# Patient Record
Sex: Female | Born: 1980 | Race: Black or African American | Hispanic: No | Marital: Single | Smoking: Former smoker
Health system: Southern US, Community
[De-identification: ages and names within clinical notes are randomized; demographics above are authoritative.]

## PROBLEM LIST (undated history)

## (undated) DIAGNOSIS — T783XXA Angioneurotic edema, initial encounter: Secondary | ICD-10-CM

## (undated) DIAGNOSIS — R45851 Suicidal ideations: Secondary | ICD-10-CM

## (undated) DIAGNOSIS — F32A Depression, unspecified: Secondary | ICD-10-CM

## (undated) DIAGNOSIS — A749 Chlamydial infection, unspecified: Secondary | ICD-10-CM

## (undated) DIAGNOSIS — G4730 Sleep apnea, unspecified: Secondary | ICD-10-CM

## (undated) DIAGNOSIS — A599 Trichomoniasis, unspecified: Secondary | ICD-10-CM

## (undated) DIAGNOSIS — J069 Acute upper respiratory infection, unspecified: Secondary | ICD-10-CM

## (undated) DIAGNOSIS — F419 Anxiety disorder, unspecified: Secondary | ICD-10-CM

## (undated) DIAGNOSIS — J45909 Unspecified asthma, uncomplicated: Secondary | ICD-10-CM

## (undated) DIAGNOSIS — I10 Essential (primary) hypertension: Secondary | ICD-10-CM

## (undated) DIAGNOSIS — F329 Major depressive disorder, single episode, unspecified: Secondary | ICD-10-CM

## (undated) DIAGNOSIS — L309 Dermatitis, unspecified: Secondary | ICD-10-CM

## (undated) HISTORY — DX: Dermatitis, unspecified: L30.9

## (undated) HISTORY — DX: Acute upper respiratory infection, unspecified: J06.9

## (undated) HISTORY — DX: Angioneurotic edema, initial encounter: T78.3XXA

## (undated) HISTORY — PX: OTHER SURGICAL HISTORY: SHX169

---

## 2011-12-18 ENCOUNTER — Emergency Department (HOSPITAL_COMMUNITY)
Admission: EM | Admit: 2011-12-18 | Discharge: 2011-12-18 | Disposition: A | Discharge status: Home / Self Care | Payer: Self-pay | Attending: Emergency Medicine | Admitting: Emergency Medicine

## 2011-12-18 ENCOUNTER — Encounter (HOSPITAL_COMMUNITY): Payer: Self-pay | Admitting: Family Medicine

## 2011-12-18 DIAGNOSIS — R109 Unspecified abdominal pain: Secondary | ICD-10-CM | POA: Insufficient documentation

## 2011-12-18 DIAGNOSIS — R079 Chest pain, unspecified: Secondary | ICD-10-CM | POA: Insufficient documentation

## 2011-12-18 DIAGNOSIS — R110 Nausea: Secondary | ICD-10-CM | POA: Insufficient documentation

## 2011-12-18 DIAGNOSIS — N898 Other specified noninflammatory disorders of vagina: Secondary | ICD-10-CM | POA: Insufficient documentation

## 2011-12-18 DIAGNOSIS — Z79899 Other long term (current) drug therapy: Secondary | ICD-10-CM | POA: Insufficient documentation

## 2011-12-18 DIAGNOSIS — F17200 Nicotine dependence, unspecified, uncomplicated: Secondary | ICD-10-CM | POA: Insufficient documentation

## 2011-12-18 DIAGNOSIS — A599 Trichomoniasis, unspecified: Secondary | ICD-10-CM | POA: Insufficient documentation

## 2011-12-18 LAB — CBC WITH DIFFERENTIAL/PLATELET
Basophils Relative: 0 % (ref 0–1)
Eosinophils Absolute: 0.1 10*3/uL (ref 0.0–0.7)
HCT: 33.9 % — ABNORMAL LOW (ref 36.0–46.0)
Lymphs Abs: 1.9 10*3/uL (ref 0.7–4.0)
MCV: 89 fL (ref 78.0–100.0)
Monocytes Relative: 9 % (ref 3–12)
RDW: 13 % (ref 11.5–15.5)
WBC: 12.6 10*3/uL — ABNORMAL HIGH (ref 4.0–10.5)

## 2011-12-18 LAB — WET PREP, GENITAL: Yeast Wet Prep HPF POC: NONE SEEN

## 2011-12-18 LAB — COMPREHENSIVE METABOLIC PANEL
ALT: 28 U/L (ref 0–35)
AST: 19 U/L (ref 0–37)
Albumin: 3.6 g/dL (ref 3.5–5.2)
Alkaline Phosphatase: 75 U/L (ref 39–117)
BUN: 8 mg/dL (ref 6–23)
Chloride: 101 mEq/L (ref 96–112)
Potassium: 3.3 mEq/L — ABNORMAL LOW (ref 3.5–5.1)
Sodium: 138 mEq/L (ref 135–145)
Total Bilirubin: 0.4 mg/dL (ref 0.3–1.2)
Total Protein: 7.6 g/dL (ref 6.0–8.3)

## 2011-12-18 LAB — URINALYSIS, ROUTINE W REFLEX MICROSCOPIC
Glucose, UA: NEGATIVE mg/dL
Ketones, ur: 40 mg/dL — AB
Leukocytes, UA: NEGATIVE
Protein, ur: 30 mg/dL — AB
pH: 6.5 (ref 5.0–8.0)

## 2011-12-18 LAB — URINE MICROSCOPIC-ADD ON

## 2011-12-18 MED ORDER — CEFTRIAXONE SODIUM 250 MG IJ SOLR
250.0000 mg | Freq: Once | INTRAMUSCULAR | Status: AC
  Administered 2011-12-18: 250 mg via INTRAMUSCULAR
  Filled 2011-12-18: qty 250

## 2011-12-18 MED ORDER — AZITHROMYCIN 1 G PO PACK
1.0000 g | PACK | Freq: Once | ORAL | Status: AC
  Administered 2011-12-18: 1 g via ORAL
  Filled 2011-12-18: qty 1

## 2011-12-18 MED ORDER — OXYCODONE-ACETAMINOPHEN 5-325 MG PO TABS
2.0000 | ORAL_TABLET | Freq: Once | ORAL | Status: AC
  Administered 2011-12-18: 2 via ORAL
  Filled 2011-12-18: qty 2

## 2011-12-18 MED ORDER — OXYCODONE-ACETAMINOPHEN 5-325 MG PO TABS: 1.0000 | ORAL_TABLET | Freq: Four times a day (QID) | ORAL | Status: AC | PRN

## 2011-12-18 MED ORDER — METRONIDAZOLE 500 MG PO TABS: 500.0000 mg | ORAL_TABLET | Freq: Two times a day (BID) | ORAL | Status: AC

## 2011-12-18 NOTE — ED Notes (Signed)
Called patient for vitals. No answer.

## 2011-12-18 NOTE — ED Notes (Signed)
Per pt sts abdominal pain, nausea and chest pain that started this am about 6. sts the abdominal pain is generalized. Denies vomiting or diarrhea. Denies urinary symptoms. sts last bm was yesterday and normal. Spotting from period right now.

## 2011-12-18 NOTE — ED Provider Notes (Signed)
History     CSN: [id number]  Arrival date & time 12/18/11  1516   First MD Initiated Contact with Patient 12/18/11 2013      Chief Complaint  Patient presents with  . Abdominal Pain  . Chest Pain  . Nausea    (Consider location/radiation/quality/duration/timing/severity/associated sxs/prior treatment) HPI Comments: Rose Gutierrez 31 y.o. female   The chief complaint is: Patient presents with:   Abdominal Pain   Chest Pain   Nausea   The patient has medical history significant for:   History reviewed. No pertinent past medical history.  Patient presents with abdominal pain and nausea that began this morning at 6am. Patient states that the abdominal pain is diffuse and about a 7/10. Associated symptoms are chest pain but patient states that she currently is suffering from bronchitis. Denies fever or chills. Denies vomiting or diarrhea but reports constipation. LMP was 8/27 and she is currently spotting. Patient states that last time she felt this way she was pregnant. Patietn is sexually active with one partner and does not use barrier. Denies vaginal discharge, dysuria, urgency, but reports frequency.      Patient is a 31 y.o. female presenting with chest pain. The history is provided by the patient.  Chest Pain Primary symptoms include cough, abdominal pain and nausea. Pertinent negatives for primary symptoms include no fever and no vomiting.     History reviewed. No pertinent past medical history.  History reviewed. No pertinent past surgical history.  History reviewed. No pertinent family history.  History  Substance Use Topics  . Smoking status: Current Everyday Smoker  . Smokeless tobacco: Not on file  . Alcohol Use: No    OB History    Grav Para Term Preterm Abortions TAB SAB Ect Mult Living                  Review of Systems  Constitutional: Negative for fever and chills.  Respiratory: Positive for cough.   Cardiovascular: Positive for chest  pain.  Gastrointestinal: Positive for nausea, abdominal pain and constipation. Negative for vomiting and diarrhea.  Genitourinary: Positive for frequency and vaginal bleeding. Negative for dysuria, urgency and vaginal discharge.  All other systems reviewed and are negative.    Allergies  Review of patient's allergies indicates no known allergies.  Home Medications   Current Outpatient Rx  Name Route Sig Dispense Refill  . ALBUTEROL SULFATE HFA 108 (90 BASE) MCG/ACT IN AERS Inhalation Inhale 2 puffs into the lungs every 6 (six) hours as needed. For stuffiness or shortness of breath    . BROMPHENIRAMINE-PSEUDOEPH 1-15 MG/5ML PO ELIX Oral Take 15 mLs by mouth every 4 (four) hours as needed. For sinus pressure      BP 153/103  Pulse 68  Temp 100.4 F (38 C) (Oral)  Resp 18  SpO2 97%  LMP 12/17/2011  Physical Exam  Nursing note and vitals reviewed. Constitutional: She appears well-developed and well-nourished.  HENT:  Head: Normocephalic and atraumatic.  Mouth/Throat: Oropharynx is clear and moist.  Eyes: Conjunctivae are normal.  Neck: Normal range of motion. Neck supple.  Cardiovascular: Normal rate, regular rhythm and normal heart sounds.   Pulmonary/Chest: Effort normal and breath sounds normal.  Abdominal: Soft. Bowel sounds are normal. She exhibits no distension and no mass. There is tenderness. There is no rebound and no guarding.       Patient tender suprapubic and RUQ.  Neurological: She is alert.  Skin: Skin is warm and dry.    ED  Course  Procedures (including critical care time)  Labs Reviewed  CBC WITH DIFFERENTIAL - Abnormal; Notable for the following:    WBC 12.6 (*)     RBC 3.81 (*)     Hemoglobin 11.0 (*)     HCT 33.9 (*)     Neutro Abs 9.5 (*)     Monocytes Absolute 1.1 (*)     All other components within normal limits  COMPREHENSIVE METABOLIC PANEL - Abnormal; Notable for the following:    Potassium 3.3 (*)     All other components within normal  limits  URINALYSIS, ROUTINE W REFLEX MICROSCOPIC - Abnormal; Notable for the following:    Color, Urine AMBER (*)  BIOCHEMICALS MAY BE AFFECTED BY COLOR   APPearance CLOUDY (*)     Hgb urine dipstick LARGE (*)     Bilirubin Urine SMALL (*)     Ketones, ur 40 (*)     Protein, ur 30 (*)     Urobilinogen, UA 2.0 (*)     All other components within normal limits  URINE MICROSCOPIC-ADD ON - Abnormal; Notable for the following:    Squamous Epithelial / LPF FEW (*)     Bacteria, UA FEW (*)     All other components within normal limits  PREGNANCY, URINE  WET PREP, GENITAL  GC/CHLAMYDIA PROBE AMP, GENITAL   Results for orders placed during the hospital encounter of 12/18/11  CBC WITH DIFFERENTIAL      Component Value Range   WBC 12.6 (*) 4.0 - 10.5 K/uL   RBC 3.81 (*) 3.87 - 5.11 MIL/uL   Hemoglobin 11.0 (*) 12.0 - 15.0 g/dL   HCT 16.1 (*) 09.6 - 04.5 %   MCV 89.0  78.0 - 100.0 fL   MCH 28.9  26.0 - 34.0 pg   MCHC 32.4  30.0 - 36.0 g/dL   RDW 40.9  81.1 - 91.4 %   Platelets 345  150 - 400 K/uL   Neutrophils Relative 76  43 - 77 %   Neutro Abs 9.5 (*) 1.7 - 7.7 K/uL   Lymphocytes Relative 15  12 - 46 %   Lymphs Abs 1.9  0.7 - 4.0 K/uL   Monocytes Relative 9  3 - 12 %   Monocytes Absolute 1.1 (*) 0.1 - 1.0 K/uL   Eosinophils Relative 1  0 - 5 %   Eosinophils Absolute 0.1  0.0 - 0.7 K/uL   Basophils Relative 0  0 - 1 %   Basophils Absolute 0.0  0.0 - 0.1 K/uL  COMPREHENSIVE METABOLIC PANEL      Component Value Range   Sodium 138  135 - 145 mEq/L   Potassium 3.3 (*) 3.5 - 5.1 mEq/L   Chloride 101  96 - 112 mEq/L   CO2 28  19 - 32 mEq/L   Glucose, Bld 93  70 - 99 mg/dL   BUN 8  6 - 23 mg/dL   Creatinine, Ser 7.82  0.50 - 1.10 mg/dL   Calcium 9.3  8.4 - 95.6 mg/dL   Total Protein 7.6  6.0 - 8.3 g/dL   Albumin 3.6  3.5 - 5.2 g/dL   AST 19  0 - 37 U/L   ALT 28  0 - 35 U/L   Alkaline Phosphatase 75  39 - 117 U/L   Total Bilirubin 0.4  0.3 - 1.2 mg/dL   GFR calc non Af Amer >90   >90 mL/min   GFR calc Af Amer >90  >90  mL/min  URINALYSIS, ROUTINE W REFLEX MICROSCOPIC      Component Value Range   Color, Urine AMBER (*) YELLOW   APPearance CLOUDY (*) CLEAR   Specific Gravity, Urine 1.027  1.005 - 1.030   pH 6.5  5.0 - 8.0   Glucose, UA NEGATIVE  NEGATIVE mg/dL   Hgb urine dipstick LARGE (*) NEGATIVE   Bilirubin Urine SMALL (*) NEGATIVE   Ketones, ur 40 (*) NEGATIVE mg/dL   Protein, ur 30 (*) NEGATIVE mg/dL   Urobilinogen, UA 2.0 (*) 0.0 - 1.0 mg/dL   Nitrite NEGATIVE  NEGATIVE   Leukocytes, UA NEGATIVE  NEGATIVE  PREGNANCY, URINE      Component Value Range   Preg Test, Ur NEGATIVE  NEGATIVE  URINE MICROSCOPIC-ADD ON      Component Value Range   Squamous Epithelial / LPF FEW (*) RARE   WBC, UA 0-2  <3 WBC/hpf   RBC / HPF 21-50  <3 RBC/hpf   Bacteria, UA FEW (*) RARE   Urine-Other MUCOUS PRESENT     Results for orders placed during the hospital encounter of 12/18/11  CBC WITH DIFFERENTIAL      Component Value Range   WBC 12.6 (*) 4.0 - 10.5 K/uL   RBC 3.81 (*) 3.87 - 5.11 MIL/uL   Hemoglobin 11.0 (*) 12.0 - 15.0 g/dL   HCT 56.2 (*) 13.0 - 86.5 %   MCV 89.0  78.0 - 100.0 fL   MCH 28.9  26.0 - 34.0 pg   MCHC 32.4  30.0 - 36.0 g/dL   RDW 78.4  69.6 - 29.5 %   Platelets 345  150 - 400 K/uL   Neutrophils Relative 76  43 - 77 %   Neutro Abs 9.5 (*) 1.7 - 7.7 K/uL   Lymphocytes Relative 15  12 - 46 %   Lymphs Abs 1.9  0.7 - 4.0 K/uL   Monocytes Relative 9  3 - 12 %   Monocytes Absolute 1.1 (*) 0.1 - 1.0 K/uL   Eosinophils Relative 1  0 - 5 %   Eosinophils Absolute 0.1  0.0 - 0.7 K/uL   Basophils Relative 0  0 - 1 %   Basophils Absolute 0.0  0.0 - 0.1 K/uL  COMPREHENSIVE METABOLIC PANEL      Component Value Range   Sodium 138  135 - 145 mEq/L   Potassium 3.3 (*) 3.5 - 5.1 mEq/L   Chloride 101  96 - 112 mEq/L   CO2 28  19 - 32 mEq/L   Glucose, Bld 93  70 - 99 mg/dL   BUN 8  6 - 23 mg/dL   Creatinine, Ser 2.84  0.50 - 1.10 mg/dL   Calcium 9.3  8.4 -  13.2 mg/dL   Total Protein 7.6  6.0 - 8.3 g/dL   Albumin 3.6  3.5 - 5.2 g/dL   AST 19  0 - 37 U/L   ALT 28  0 - 35 U/L   Alkaline Phosphatase 75  39 - 117 U/L   Total Bilirubin 0.4  0.3 - 1.2 mg/dL   GFR calc non Af Amer >90  >90 mL/min   GFR calc Af Amer >90  >90 mL/min  URINALYSIS, ROUTINE W REFLEX MICROSCOPIC      Component Value Range   Color, Urine AMBER (*) YELLOW   APPearance CLOUDY (*) CLEAR   Specific Gravity, Urine 1.027  1.005 - 1.030   pH 6.5  5.0 - 8.0   Glucose, UA NEGATIVE  NEGATIVE mg/dL   Hgb urine dipstick LARGE (*) NEGATIVE   Bilirubin Urine SMALL (*) NEGATIVE   Ketones, ur 40 (*) NEGATIVE mg/dL   Protein, ur 30 (*) NEGATIVE mg/dL   Urobilinogen, UA 2.0 (*) 0.0 - 1.0 mg/dL   Nitrite NEGATIVE  NEGATIVE   Leukocytes, UA NEGATIVE  NEGATIVE  PREGNANCY, URINE      Component Value Range   Preg Test, Ur NEGATIVE  NEGATIVE  URINE MICROSCOPIC-ADD ON      Component Value Range   Squamous Epithelial / LPF FEW (*) RARE   WBC, UA 0-2  <3 WBC/hpf   RBC / HPF 21-50  <3 RBC/hpf   Bacteria, UA FEW (*) RARE   Urine-Other MUCOUS PRESENT    WET PREP, GENITAL      Component Value Range   Yeast Wet Prep HPF POC NONE SEEN  NONE SEEN   Trich, Wet Prep MODERATE (*) NONE SEEN   Clue Cells Wet Prep HPF POC FEW (*) NONE SEEN   WBC, Wet Prep HPF POC MODERATE (*) NONE SEEN     Date: 12/18/2011  Rate: 69  Rhythm: normal sinus rhythm  QRS Axis: normal  Intervals: normal  ST/T Wave abnormalities: normal  Conduction Disutrbances:none  Narrative Interpretation: No stemi  Old EKG Reviewed: none available   No results found.   1. Trichimoniasis   2. Abdominal pain       MDM  Patient presented with abdominal pain and nausea that began this morning. Patient given pain medication in ED EKG: unremarkable CBC: mild leukocytosis CMP: hypokalemia UA: remarkable for blood as patient is on her period and trichomonas. Pelvic exam: no CMT or adnexal tenderness. Wet prep: confirmatory  for Trich. High white count suspicious for other STD's. Patient treated empirically for gc/chlamydia and discharged on Flagyl. No red flags for PID. Return precautions given verbally and in discharge instructions.      Pixie Casino, PA-C 12/18/11 2244

## 2011-12-18 NOTE — ED Notes (Signed)
Pt in c/o sinus congestion x3-4 days, then developed chest and back pain, then developed generalized abd pain, denies n/v/d, states she is unsure if she is pregnant and that last period was irregular, pt noted spotty vaginal bleeding when abd pain started.

## 2011-12-19 LAB — GC/CHLAMYDIA PROBE AMP, GENITAL
Chlamydia, DNA Probe: NEGATIVE
GC Probe Amp, Genital: NEGATIVE

## 2011-12-19 NOTE — ED Provider Notes (Signed)
Medical screening examination/treatment/procedure(s) were performed by non-physician practitioner and as supervising physician I was immediately available for consultation/collaboration.   Eisha Chatterjee, MD 12/19/11 2213 

## 2012-06-16 ENCOUNTER — Encounter (HOSPITAL_COMMUNITY): Payer: Self-pay | Admitting: *Deleted

## 2012-06-16 ENCOUNTER — Emergency Department (HOSPITAL_COMMUNITY): Payer: Self-pay

## 2012-06-16 ENCOUNTER — Emergency Department (HOSPITAL_COMMUNITY)
Admission: EM | Admit: 2012-06-16 | Discharge: 2012-06-17 | Disposition: A | Discharge status: Home / Self Care | Payer: Self-pay | Attending: Emergency Medicine | Admitting: Emergency Medicine

## 2012-06-16 DIAGNOSIS — G479 Sleep disorder, unspecified: Secondary | ICD-10-CM | POA: Insufficient documentation

## 2012-06-16 DIAGNOSIS — R51 Headache: Secondary | ICD-10-CM | POA: Insufficient documentation

## 2012-06-16 DIAGNOSIS — N390 Urinary tract infection, site not specified: Secondary | ICD-10-CM

## 2012-06-16 DIAGNOSIS — I10 Essential (primary) hypertension: Secondary | ICD-10-CM | POA: Insufficient documentation

## 2012-06-16 DIAGNOSIS — F419 Anxiety disorder, unspecified: Secondary | ICD-10-CM

## 2012-06-16 DIAGNOSIS — F411 Generalized anxiety disorder: Secondary | ICD-10-CM | POA: Insufficient documentation

## 2012-06-16 DIAGNOSIS — R0989 Other specified symptoms and signs involving the circulatory and respiratory systems: Secondary | ICD-10-CM | POA: Insufficient documentation

## 2012-06-16 DIAGNOSIS — Z79899 Other long term (current) drug therapy: Secondary | ICD-10-CM | POA: Insufficient documentation

## 2012-06-16 DIAGNOSIS — R0609 Other forms of dyspnea: Secondary | ICD-10-CM | POA: Insufficient documentation

## 2012-06-16 DIAGNOSIS — F17200 Nicotine dependence, unspecified, uncomplicated: Secondary | ICD-10-CM | POA: Insufficient documentation

## 2012-06-16 DIAGNOSIS — J45909 Unspecified asthma, uncomplicated: Secondary | ICD-10-CM | POA: Insufficient documentation

## 2012-06-16 HISTORY — DX: Unspecified asthma, uncomplicated: J45.909

## 2012-06-16 HISTORY — DX: Essential (primary) hypertension: I10

## 2012-06-16 LAB — COMPREHENSIVE METABOLIC PANEL
Alkaline Phosphatase: 57 U/L (ref 39–117)
BUN: 9 mg/dL (ref 6–23)
Chloride: 102 mEq/L (ref 96–112)
Creatinine, Ser: 0.64 mg/dL (ref 0.50–1.10)
GFR calc Af Amer: >90 mL/min (ref >90)
Glucose, Bld: 93 mg/dL (ref 70–99)
Potassium: 4.2 mEq/L (ref 3.5–5.1)
Total Bilirubin: 0.2 mg/dL — ABNORMAL LOW (ref 0.3–1.2)
Total Protein: 7.5 g/dL (ref 6.0–8.3)

## 2012-06-16 LAB — CBC WITH DIFFERENTIAL/PLATELET
Eosinophils Absolute: 0.1 10*3/uL (ref 0.0–0.7)
HCT: 34.8 % — ABNORMAL LOW (ref 36.0–46.0)
Hemoglobin: 11.4 g/dL — ABNORMAL LOW (ref 12.0–15.0)
Lymphs Abs: 3.1 10*3/uL (ref 0.7–4.0)
MCH: 28.9 pg (ref 26.0–34.0)
Monocytes Absolute: 0.9 10*3/uL (ref 0.1–1.0)
Monocytes Relative: 10 % (ref 3–12)
Neutrophils Relative %: 53 % (ref 43–77)
RBC: 3.94 MIL/uL (ref 3.87–5.11)

## 2012-06-16 LAB — URINALYSIS, ROUTINE W REFLEX MICROSCOPIC
Ketones, ur: 15 mg/dL — AB
Nitrite: POSITIVE — AB
Protein, ur: >300 mg/dL — AB

## 2012-06-16 MED ORDER — LORAZEPAM 1 MG PO TABS: 1.0000 mg | ORAL_TABLET | Freq: Three times a day (TID) | ORAL | Status: DC | PRN

## 2012-06-16 MED ORDER — NITROFURANTOIN MONOHYD MACRO 100 MG PO CAPS: 100.0000 mg | ORAL_CAPSULE | Freq: Two times a day (BID) | ORAL | Status: DC

## 2012-06-16 MED ORDER — LORAZEPAM 2 MG/ML IJ SOLN
1.0000 mg | Freq: Once | INTRAMUSCULAR | Status: AC
  Administered 2012-06-16: 1 mg via INTRAVENOUS
  Filled 2012-06-16: qty 1

## 2012-06-16 MED ORDER — SODIUM CHLORIDE 0.9 % IV BOLUS (SEPSIS)
1000.0000 mL | Freq: Once | INTRAVENOUS | Status: AC
  Administered 2012-06-16: 1000 mL via INTRAVENOUS

## 2012-06-16 NOTE — ED Notes (Signed)
Patient reports having a bad headache which started approximately 1330.  Pt states she did not take anything for her headache.  Reports feeling shaky also.  Patient reports other people have said it sounds like she has anxiety, but she had not seen a psychiatrist or been diagnosed with anxiety.  Patient reports she had some tightness in her chest earlier but not pain and is not having any chest pain.   Denies nausea, vomiting.  Patient does have history of sleep apnea but has not been wearing c-pap at night.

## 2012-06-16 NOTE — ED Notes (Signed)
Per [facility], under a lot of stress, increased anxiety, co-workers at work said she passed out-went straight down from standing position-did not hit head, no LOC noncompliant with PCP

## 2012-06-16 NOTE — ED Provider Notes (Signed)
History     CSN: [id number]  Arrival date & time 06/16/12  4098   First MD Initiated Contact with Patient 06/16/12 2103      Chief Complaint  Patient presents with  . Anxiety  . Headache    (Consider location/radiation/quality/duration/timing/severity/associated sxs/prior treatment) HPI  The patient presents with concern of persistent anxiety, generalized discomfort, persistent dyspnea.  She states that this has been present for months, and she is pretty been diagnosed with sleep apnea, but has no formal diagnosis of anxiety.  She states over the past months she's had persistent anxious sensation everyday, worse with mild stressors.  She also notes that she's had multiple events similar to today's.  Today, and work, after a stressful.  She felt generally anxious, weak, fell to the floor.  She has complete recollection of the entire event.  She denies chest pain or dyspnea throughout the entire event.  She specifies that her dyspnea is more a subjective sense than true dyspnea. She denies concurrent fevers, chills, dysuria, hematuria, abdominal pain, vomiting, diarrhea. She has not seen any physicians while she has been anxious.   Past Medical History  Diagnosis Date  . Hypertension   . Asthma     Past Surgical History  Procedure Laterality Date  . Cesearean section      History reviewed. No pertinent family history.  History  Substance Use Topics  . Smoking status: Current Every Day Smoker -- 1.00 packs/day    Types: Cigarettes  . Smokeless tobacco: Not on file  . Alcohol Use: No    OB History   Grav Para Term Preterm Abortions TAB SAB Ect Mult Living                  Review of Systems  Constitutional:       Per HPI, otherwise negative  HENT:       Per HPI, otherwise negative  Respiratory:       Per HPI, otherwise negative  Cardiovascular:       Per HPI, otherwise negative  Gastrointestinal: Negative for vomiting.  Endocrine:       Negative aside from HPI   Genitourinary:       Neg aside from HPI   Musculoskeletal:       Per HPI, otherwise negative  Skin: Negative.   Neurological: Negative for syncope.  Psychiatric/Behavioral: Positive for sleep disturbance. Negative for suicidal ideas. The patient is nervous/anxious.     Allergies  Review of patient's allergies indicates no known allergies.  Home Medications   Current Outpatient Rx  Name  Route  Sig  Dispense  Refill  . albuterol (PROVENTIL HFA;VENTOLIN HFA) 108 (90 BASE) MCG/ACT inhaler   Inhalation   Inhale 2 puffs into the lungs every 6 (six) hours as needed. For stuffiness or shortness of breath           BP 126/79  Pulse 71  Temp(Src) 99.1 F (37.3 C) (Oral)  Resp 20  SpO2 99%  LMP 06/16/2012  Physical Exam  Nursing note and vitals reviewed. Constitutional: She is oriented to person, place, and time. She appears well-developed and well-nourished. No distress.  HENT:  Head: Normocephalic and atraumatic.  Eyes: Conjunctivae and EOM are normal.  Cardiovascular: Normal rate and regular rhythm.   Pulmonary/Chest: Effort normal and breath sounds normal. No stridor. No respiratory distress.  Abdominal: She exhibits no distension.  Musculoskeletal: She exhibits no edema.  Neurological: She is alert and oriented to person, place, and time. No cranial  nerve deficit.  Skin: Skin is warm and dry.  Psychiatric: Her speech is normal and behavior is normal. Judgment and thought content normal. Her mood appears anxious. Cognition and memory are normal.    ED Course  Procedures (including critical care time)  Labs Reviewed  URINALYSIS, ROUTINE W REFLEX MICROSCOPIC - Abnormal; Notable for the following:    Color, Urine RED (*)    APPearance TURBID (*)    Specific Gravity, Urine 1.036 (*)    Hgb urine dipstick LARGE (*)    Bilirubin Urine MODERATE (*)    Ketones, ur 15 (*)    Protein, ur >300 (*)    Nitrite POSITIVE (*)    Leukocytes, UA SMALL (*)    All other  components within normal limits  URINE MICROSCOPIC-ADD ON - Abnormal; Notable for the following:    Bacteria, UA MANY (*)    All other components within normal limits  URINE CULTURE  CBC WITH DIFFERENTIAL  COMPREHENSIVE METABOLIC PANEL   Dg Chest 2 View  06/16/2012  *RADIOLOGY REPORT*  Clinical Data: Chest pain  CHEST - 2 VIEW  Comparison: None  Findings: The cardiomediastinal silhouette is unremarkable. Mild peribronchial thickening is noted. There is no evidence of focal airspace disease, pulmonary edema, suspicious pulmonary nodule/mass, pleural effusion, or pneumothorax. No acute bony abnormalities are identified.  IMPRESSION: Mild peribronchial thickening without focal pneumonia.   Original Report Authenticated By: Harmon Pier, M.D.      No diagnosis found.    MDM  This generally well-appearing female presents with persistent anxiety, and following one episode of profound weakness.  On exam she is appropriately awake, alert, oriented, in no distress.  Given prescription of anxiety, the absence of true loss of consciousness, or any evidence of seizure-like activity, there suspicion for psychogenic etiology.  I discussed with the patient.  The patient's labs are notable for demonstration of urinary tract infection, but are otherwise reassuring.  We discussed the need for primary care followup, and she was started on antibiotics with anxiolytic as well.      Gerhard Munch, MD 06/16/12 2229

## 2012-06-16 NOTE — ED Notes (Signed)
Pt states she was at work and thinks she passed out,  States she is stressed and has a headache.  Says she knows what triggered.  States no known injuries when she passed out.  Pt is very tearful during initial triage session,  States she feels like a big baby.  I reassured pt and comforted her.

## 2012-06-18 LAB — URINE CULTURE: Colony Count: >=100000

## 2012-10-13 ENCOUNTER — Emergency Department (INDEPENDENT_AMBULATORY_CARE_PROVIDER_SITE_OTHER)
Admission: EM | Admit: 2012-10-13 | Discharge: 2012-10-13 | Disposition: A | Discharge status: Home / Self Care | Payer: Medicaid Other | Source: Home / Self Care | Attending: Family Medicine | Admitting: Family Medicine

## 2012-10-13 ENCOUNTER — Encounter (HOSPITAL_COMMUNITY): Payer: Self-pay | Admitting: *Deleted

## 2012-10-13 DIAGNOSIS — S336XXA Sprain of sacroiliac joint, initial encounter: Secondary | ICD-10-CM

## 2012-10-13 DIAGNOSIS — S335XXA Sprain of ligaments of lumbar spine, initial encounter: Secondary | ICD-10-CM

## 2012-10-13 MED ORDER — CYCLOBENZAPRINE HCL 5 MG PO TABS: 5.0000 mg | ORAL_TABLET | Freq: Three times a day (TID) | ORAL | Status: DC | PRN

## 2012-10-13 MED ORDER — KETOROLAC TROMETHAMINE 30 MG/ML IJ SOLN
30.0000 mg | Freq: Once | INTRAMUSCULAR | Status: AC
  Administered 2012-10-13: 30 mg via INTRAMUSCULAR

## 2012-10-13 MED ORDER — DICLOFENAC POTASSIUM 50 MG PO TABS: 50.0000 mg | ORAL_TABLET | Freq: Three times a day (TID) | ORAL | Status: DC

## 2012-10-13 NOTE — ED Notes (Signed)
Pt  Reports   Low  Back  Pain  X    sev  Days  denys   specefic   Injury    Ambulated  To  Room with  Steady  Fluid  Gait    She  Is  Sitting  Upright on the  Exam table  Speaking in  Complete  sentances   In no  Severe  Distress

## 2012-10-13 NOTE — ED Provider Notes (Signed)
History    CSN: [id number] Arrival date & time 10/13/12  1537  First MD Initiated Contact with Patient 10/13/12 1604     Chief Complaint  Patient presents with  . Back Pain   (Consider location/radiation/quality/duration/timing/severity/associated sxs/prior Treatment) Patient is a 32 y.o. female presenting with back pain. The history is provided by the patient.  Back Pain Location:  Generalized Quality:  Stiffness Stiffness is present:  All day Pain severity:  Mild Onset quality:  Sudden Duration:  2 days Timing:  Constant Progression:  Unchanged Context: twisting   Context: not recent illness and not recent injury   Relieved by:  None tried Worsened by:  Nothing tried Associated symptoms: no abdominal pain, no bladder incontinence, no bowel incontinence, no leg pain and no weakness   Risk factors comment:  H/o similar spasms.  Past Medical History  Diagnosis Date  . Hypertension   . Asthma    Past Surgical History  Procedure Laterality Date  . Cesearean section     History reviewed. No pertinent family history. History  Substance Use Topics  . Smoking status: Current Every Day Smoker -- 1.00 packs/day    Types: Cigarettes  . Smokeless tobacco: Not on file  . Alcohol Use: No   OB History   Grav Para Term Preterm Abortions TAB SAB Ect Mult Living                 Review of Systems  Constitutional: Negative.   Gastrointestinal: Negative.  Negative for abdominal pain and bowel incontinence.  Genitourinary: Negative.  Negative for bladder incontinence.  Musculoskeletal: Positive for myalgias and back pain. Negative for joint swelling and gait problem.  Skin: Negative.   Neurological: Negative for weakness.    Allergies  Review of patient's allergies indicates no known allergies.  Home Medications   Current Outpatient Rx  Name  Route  Sig  Dispense  Refill  . albuterol (PROVENTIL HFA;VENTOLIN HFA) 108 (90 BASE) MCG/ACT inhaler   Inhalation   Inhale 2  puffs into the lungs every 6 (six) hours as needed. For stuffiness or shortness of breath         . cyclobenzaprine (FLEXERIL) 5 MG tablet   Oral   Take 1 tablet (5 mg total) by mouth 3 (three) times daily as needed for muscle spasms.   30 tablet   0   . diclofenac (CATAFLAM) 50 MG tablet   Oral   Take 1 tablet (50 mg total) by mouth 3 (three) times daily.   30 tablet   0   . LORazepam (ATIVAN) 1 MG tablet   Oral   Take 1 tablet (1 mg total) by mouth 3 (three) times daily as needed for anxiety.   15 tablet   0   . nitrofurantoin, macrocrystal-monohydrate, (MACROBID) 100 MG capsule   Oral   Take 1 capsule (100 mg total) by mouth 2 (two) times daily.   10 capsule   0    BP 130/97  Pulse 71  Temp(Src) 98.1 F (36.7 C) (Oral)  Resp 18  SpO2 100%  LMP 09/20/2012 Physical Exam  Nursing note and vitals reviewed. Constitutional: She is oriented to person, place, and time. She appears well-developed and well-nourished.  Neck: Normal range of motion. Neck supple.  Abdominal: Soft. Bowel sounds are normal.  Musculoskeletal: She exhibits tenderness.       Lumbar back: She exhibits tenderness and spasm. She exhibits normal range of motion, no bony tenderness, no swelling, no edema, no deformity  and normal pulse.  Neurological: She is alert and oriented to person, place, and time.  Skin: Skin is warm and dry.    ED Course  Procedures (including critical care time) Labs Reviewed - No data to display No results found. 1. Low back sprain, initial encounter     MDM    Linna Hoff, MD 10/13/12 1626

## 2012-11-09 ENCOUNTER — Emergency Department (INDEPENDENT_AMBULATORY_CARE_PROVIDER_SITE_OTHER)
Admission: EM | Admit: 2012-11-09 | Discharge: 2012-11-09 | Disposition: A | Discharge status: Home / Self Care | Payer: Self-pay | Source: Home / Self Care | Attending: Family Medicine | Admitting: Family Medicine

## 2012-11-09 ENCOUNTER — Encounter (HOSPITAL_COMMUNITY): Payer: Self-pay

## 2012-11-09 DIAGNOSIS — K120 Recurrent oral aphthae: Secondary | ICD-10-CM

## 2012-11-09 NOTE — ED Notes (Signed)
C/o sore throat x 2 weeks, no relief w home treatment

## 2012-11-09 NOTE — ED Provider Notes (Signed)
   History    CSN: [id number] Arrival date & time 11/09/12  1629  First MD Initiated Contact with Patient 11/09/12 1735     Chief Complaint  Patient presents with  . Sore Throat   (Consider location/radiation/quality/duration/timing/severity/associated sxs/prior Treatment) Patient is a 32 y.o. female presenting with pharyngitis. The history is provided by the patient.  Sore Throat This is a new problem. The current episode started more than 1 week ago (yest noticed white spot on tonsil and sore to soft palate.). The problem has not changed since onset.The symptoms are aggravated by swallowing.   Past Medical History  Diagnosis Date  . Hypertension   . Asthma    Past Surgical History  Procedure Laterality Date  . Cesearean section     History reviewed. No pertinent family history. History  Substance Use Topics  . Smoking status: Current Every Day Smoker -- 1.00 packs/day    Types: Cigarettes  . Smokeless tobacco: Not on file  . Alcohol Use: No   OB History   Grav Para Term Preterm Abortions TAB SAB Ect Mult Living                 Review of Systems  Constitutional: Negative for fever.  HENT: Positive for congestion, sore throat, rhinorrhea and mouth sores. Negative for trouble swallowing.     Allergies  Review of patient's allergies indicates no known allergies.  Home Medications   Current Outpatient Rx  Name  Route  Sig  Dispense  Refill  . albuterol (PROVENTIL HFA;VENTOLIN HFA) 108 (90 BASE) MCG/ACT inhaler   Inhalation   Inhale 2 puffs into the lungs every 6 (six) hours as needed. For stuffiness or shortness of breath         . cyclobenzaprine (FLEXERIL) 5 MG tablet   Oral   Take 1 tablet (5 mg total) by mouth 3 (three) times daily as needed for muscle spasms.   30 tablet   0   . diclofenac (CATAFLAM) 50 MG tablet   Oral   Take 1 tablet (50 mg total) by mouth 3 (three) times daily.   30 tablet   0   . LORazepam (ATIVAN) 1 MG tablet   Oral   Take 1  tablet (1 mg total) by mouth 3 (three) times daily as needed for anxiety.   15 tablet   0   . nitrofurantoin, macrocrystal-monohydrate, (MACROBID) 100 MG capsule   Oral   Take 1 capsule (100 mg total) by mouth 2 (two) times daily.   10 capsule   0    BP 146/89  Pulse 66  Temp(Src) 98.2 F (36.8 C) (Oral)  Resp 20  SpO2 100%  LMP 09/20/2012 Physical Exam  Nursing note and vitals reviewed. Constitutional: She is oriented to person, place, and time. She appears well-developed and well-nourished.  HENT:  Right Ear: External ear normal.  Left Ear: External ear normal.  Nose: Nose normal.  Stomatitis lesion on soft palate and tonsillar concretion on right , no erythema or exudates.  Neck: Normal range of motion. Neck supple.  Lymphadenopathy:    She has no cervical adenopathy.  Neurological: She is alert and oriented to person, place, and time.  Skin: Skin is dry.    ED Course  Procedures (including critical care time) Labs Reviewed - No data to display No results found. 1. Aphthous stomatitis     MDM    Linna Hoff, MD 11/09/12 [phone]

## 2012-11-29 ENCOUNTER — Encounter (HOSPITAL_COMMUNITY): Payer: Self-pay | Admitting: *Deleted

## 2012-11-29 ENCOUNTER — Emergency Department (HOSPITAL_COMMUNITY)
Admission: EM | Admit: 2012-11-29 | Discharge: 2012-11-29 | Disposition: A | Discharge status: Home / Self Care | Payer: Medicaid Other | Attending: Emergency Medicine | Admitting: Emergency Medicine

## 2012-11-29 DIAGNOSIS — G8929 Other chronic pain: Secondary | ICD-10-CM | POA: Insufficient documentation

## 2012-11-29 DIAGNOSIS — Z79899 Other long term (current) drug therapy: Secondary | ICD-10-CM | POA: Insufficient documentation

## 2012-11-29 DIAGNOSIS — M549 Dorsalgia, unspecified: Secondary | ICD-10-CM

## 2012-11-29 DIAGNOSIS — J45909 Unspecified asthma, uncomplicated: Secondary | ICD-10-CM | POA: Insufficient documentation

## 2012-11-29 DIAGNOSIS — F17200 Nicotine dependence, unspecified, uncomplicated: Secondary | ICD-10-CM | POA: Insufficient documentation

## 2012-11-29 DIAGNOSIS — I10 Essential (primary) hypertension: Secondary | ICD-10-CM | POA: Insufficient documentation

## 2012-11-29 MED ORDER — HYDROCODONE-ACETAMINOPHEN 5-325 MG PO TABS: 1.0000 | ORAL_TABLET | ORAL | Status: DC | PRN

## 2012-11-29 MED ORDER — CYCLOBENZAPRINE HCL 10 MG PO TABS: 10.0000 mg | ORAL_TABLET | Freq: Two times a day (BID) | ORAL | Status: DC | PRN

## 2012-11-29 NOTE — ED Notes (Signed)
Pt states she has muscle spasms and has been having spasms all weekend. Pt had a rx from urgent care for the spasms and is currently out. Pt states that today her lower back has been painful. Pt denies problems with bowel or urine.

## 2012-11-29 NOTE — ED Provider Notes (Signed)
  CSN: [id number]     Arrival date & time 11/29/12  2129 History  This chart was scribed for Tommy Rainwater, non-physician practitioner working with Richardean Canal, MD by Leone Payor, ED Scribe. This patient was seen in room TR08C/TR08C and the patient's care was started at 2129.    Chief Complaint  Patient presents with  . Back Pain   The history is provided by the patient. No language interpreter was used.    HPI Comments: Rose Gutierrez is a 32 y.o. female who presents to the Emergency Department complaining of muscle spasms to the upper to mid back that has been ongoing in the last 1-2 day. Pt states she has had back spasms on and off for the last 2 years. Pt denies radiation to upper extremities. She tries to do exercises to help alleviate the pain. She denies change in bowel or bladder function.       Past Medical History  Diagnosis Date  . Hypertension   . Asthma    Past Surgical History  Procedure Laterality Date  . Cesearean section     History reviewed. No pertinent family history. History  Substance Use Topics  . Smoking status: Current Every Day Smoker -- 1.00 packs/day    Types: Cigarettes  . Smokeless tobacco: Not on file  . Alcohol Use: No   OB History   Grav Para Term Preterm Abortions TAB SAB Ect Mult Living                 Review of Systems  Musculoskeletal: Positive for back pain.  Neurological: Negative for weakness.    Allergies  Review of patient's allergies indicates no known allergies.  Home Medications   Current Outpatient Rx  Name  Route  Sig  Dispense  Refill  . LORazepam (ATIVAN) 1 MG tablet   Oral   Take 1 tablet (1 mg total) by mouth 3 (three) times daily as needed for anxiety.   15 tablet   0    BP 151/99  Pulse 90  Temp(Src) 99 F (37.2 C) (Oral)  Resp 18  SpO2 99% Physical Exam  Nursing note and vitals reviewed. Constitutional: She is oriented to person, place, and time. She appears well-developed and well-nourished.   HENT:  Head: Normocephalic and atraumatic.  Neck: Neck supple.  Pulmonary/Chest: Effort normal.  Musculoskeletal:  No midline cervical or thoracic tenderness. Mild to moderate bilateral paraspinal tenderness.  Neurological: She is alert and oriented to person, place, and time. No cranial nerve deficit.  Psychiatric: She has a normal mood and affect. Her behavior is normal.    ED Course   Procedures (including critical care time)  DIAGNOSTIC STUDIES: Oxygen Saturation is 99% on RA, normal by my interpretation.    COORDINATION OF CARE: 10:05 PM Discussed treatment plan with pt at bedside and pt agreed to plan.   Labs Reviewed - No data to display No results found. No diagnosis found. 1. Muscular back pain MDM  Chronic upper back pain c/w muscular tension.  I personally performed the services described in this documentation, which was scribed in my presence. The recorded information has been reviewed and is accurate.     Arnoldo Hooker, PA-C 11/29/12 [phone]

## 2012-11-30 NOTE — ED Provider Notes (Signed)
Medical screening examination/treatment/procedure(s) were performed by non-physician practitioner and as supervising physician I was immediately available for consultation/collaboration.   David H Yao, MD 11/30/12 0002 

## 2013-03-04 ENCOUNTER — Inpatient Hospital Stay (EMERGENCY_DEPARTMENT_HOSPITAL)
Admission: AD | Admit: 2013-03-04 | Discharge: 2013-03-04 | Disposition: A | Discharge status: Home / Self Care | Payer: Medicaid Other | Source: Ambulatory Visit | Attending: Obstetrics & Gynecology | Admitting: Obstetrics & Gynecology

## 2013-03-04 ENCOUNTER — Encounter (HOSPITAL_COMMUNITY): Payer: Self-pay | Admitting: *Deleted

## 2013-03-04 ENCOUNTER — Emergency Department (HOSPITAL_COMMUNITY)
Admission: AD | Admit: 2013-03-04 | Discharge: 2013-03-04 | Disposition: A | Discharge status: Home / Self Care | Payer: Medicaid Other | Source: Ambulatory Visit | Attending: Obstetrics & Gynecology | Admitting: Obstetrics & Gynecology

## 2013-03-04 ENCOUNTER — Encounter (HOSPITAL_COMMUNITY): Payer: Self-pay | Admitting: Emergency Medicine

## 2013-03-04 DIAGNOSIS — K529 Noninfective gastroenteritis and colitis, unspecified: Secondary | ICD-10-CM

## 2013-03-04 DIAGNOSIS — Z79899 Other long term (current) drug therapy: Secondary | ICD-10-CM

## 2013-03-04 DIAGNOSIS — N644 Mastodynia: Secondary | ICD-10-CM

## 2013-03-04 DIAGNOSIS — Z3202 Encounter for pregnancy test, result negative: Secondary | ICD-10-CM

## 2013-03-04 DIAGNOSIS — I10 Essential (primary) hypertension: Secondary | ICD-10-CM | POA: Insufficient documentation

## 2013-03-04 DIAGNOSIS — R110 Nausea: Secondary | ICD-10-CM | POA: Insufficient documentation

## 2013-03-04 DIAGNOSIS — F17200 Nicotine dependence, unspecified, uncomplicated: Secondary | ICD-10-CM | POA: Insufficient documentation

## 2013-03-04 DIAGNOSIS — R197 Diarrhea, unspecified: Secondary | ICD-10-CM | POA: Insufficient documentation

## 2013-03-04 DIAGNOSIS — J45909 Unspecified asthma, uncomplicated: Secondary | ICD-10-CM | POA: Insufficient documentation

## 2013-03-04 HISTORY — DX: Anxiety disorder, unspecified: F41.9

## 2013-03-04 LAB — URINALYSIS, ROUTINE W REFLEX MICROSCOPIC
Bilirubin Urine: NEGATIVE
Ketones, ur: NEGATIVE mg/dL
Nitrite: NEGATIVE
pH: 6.5 (ref 5.0–8.0)

## 2013-03-04 LAB — URINE MICROSCOPIC-ADD ON

## 2013-03-04 LAB — HCG, QUANTITATIVE, PREGNANCY: hCG, Beta Chain, Quant, S: <1 m[IU]/mL (ref <5)

## 2013-03-04 MED ORDER — CEFTRIAXONE SODIUM 250 MG IJ SOLR: 250.0000 mg | Freq: Once | INTRAMUSCULAR | Status: DC

## 2013-03-04 MED ORDER — AZITHROMYCIN 250 MG PO TABS: 1000.0000 mg | ORAL_TABLET | Freq: Once | ORAL | Status: DC

## 2013-03-04 NOTE — MAU Provider Note (Signed)
Chief Complaint: Possible Pregnancy  First Provider Initiated Contact with Patient 03/04/13 1700     SUBJECTIVE HPI: Rose Gutierrez is a 32 y.o. female who presents with concern for pregnancy due to nausea, loose stools and sore breasts x 3 days. No nausea now. Few loose stools per day.  Patient's last menstrual period was 02/15/2013. Neg home UPT. Concerned because she states UPTs have not been positive when she was pregnant in the past and because she takes Ativan for anxiety and has not been taking it this week because of pregnancy Sx and it being category D. States she is having significant anxiety and really needs to take it if she is not pregnant.    Past Medical History  Diagnosis Date  . Hypertension   . Asthma        Anxiety  OB History  Gravida Para Term Preterm AB SAB TAB Ectopic Multiple Living  4 3 3  0 1 1 0 0 0 3    # Outcome Date GA Lbr Len/2nd Weight Sex Delivery Anes PTL Lv  4 SAB           3 TRM           2 TRM           1 TRM              Past Surgical History  Procedure Laterality Date  . Cesearean section    . Cesarean section     History   Social History  . Marital Status: Single    Spouse Name: N/A    Number of Children: N/A  . Years of Education: N/A   Occupational History  . Not on file.   Social History Main Topics  . Smoking status: Current Every Day Smoker -- 1.00 packs/day    Types: Cigarettes  . Smokeless tobacco: Not on file  . Alcohol Use: No  . Drug Use: No  . Sexual Activity: Yes   Other Topics Concern  . Not on file   Social History Narrative  . No narrative on file   No current facility-administered medications on file prior to encounter.   Current Outpatient Prescriptions on File Prior to Encounter  Medication Sig Dispense Refill  . LORazepam (ATIVAN) 1 MG tablet Take 1 tablet (1 mg total) by mouth 3 (three) times daily as needed for anxiety.  15 tablet  0   No Known Allergies  ROS: Pertinent items in  HPI  OBJECTIVE Blood pressure 122/88, pulse 95, temperature 98.8 F (37.1 C), temperature source Oral, resp. rate 16, height 5\' 5"  (1.651 m), weight 126.191 kg (278 lb 3.2 oz), last menstrual period 02/15/2013, SpO2 97.00%. GENERAL: Well-developed, well-nourished female in no acute distress.  HEENT: Normocephalic HEART: normal rate RESP: normal effort ABDOMEN: Soft, non-tender EXTREMITIES: Nontender, no edema NEURO: Alert and oriented SPECULUM EXAM: Deferred  LAB RESULTS Results for orders placed during the hospital encounter of 03/04/13 (from the past 24 hour(s))  URINALYSIS, ROUTINE W REFLEX MICROSCOPIC     Status: Abnormal   Collection Time    03/04/13  3:45 PM      Result Value Range   Color, Urine YELLOW  YELLOW   APPearance CLEAR  CLEAR   Specific Gravity, Urine 1.010  1.005 - 1.030   pH 6.5  5.0 - 8.0   Glucose, UA NEGATIVE  NEGATIVE mg/dL   Hgb urine dipstick MODERATE (*) NEGATIVE   Bilirubin Urine NEGATIVE  NEGATIVE   Ketones, ur NEGATIVE  NEGATIVE mg/dL   Protein, ur NEGATIVE  NEGATIVE mg/dL   Urobilinogen, UA 0.2  0.0 - 1.0 mg/dL   Nitrite NEGATIVE  NEGATIVE   Leukocytes, UA NEGATIVE  NEGATIVE  URINE MICROSCOPIC-ADD ON     Status: Abnormal   Collection Time    03/04/13  3:45 PM      Result Value Range   Squamous Epithelial / LPF MANY (*) RARE   WBC, UA 0-2  <3 WBC/hpf   RBC / HPF 0-2  <3 RBC/hpf   Bacteria, UA FEW (*) RARE  POCT PREGNANCY, URINE     Status: None   Collection Time    03/04/13  3:58 PM      Result Value Range   Preg Test, Ur NEGATIVE  NEGATIVE  HCG, QUANTITATIVE, PREGNANCY     Status: None   Collection Time    03/04/13  4:57 PM      Result Value Range   hCG, Beta Chain, Quant, S <1  <5 mIU/mL    IMAGING No results found.  MAU COURSE  ASSESSMENT 1. Use teratogenic medication in female of reproductive age   2. Gastroenteritis, acute   3. Mastalgia in female   4. Pregnancy test negative    PLAN Discharge home in stable  condition. Start contraception if pregnancy not desired.      Follow-up Information   Follow up with Primary care provider. (As needed if gastrointestinal symptoms continue)        Medication List    ASK your doctor about these medications       LORazepam 1 MG tablet  Commonly known as:  ATIVAN  Take 1 tablet (1 mg total) by mouth 3 (three) times daily as needed for anxiety.       [place], [place] 03/04/2013  8:33 PM

## 2013-03-04 NOTE — MAU Note (Signed)
Pt possibiltiy. Pt states"I want a blood test" Urine test has been negative.

## 2013-03-04 NOTE — ED Notes (Signed)
Pt thinks she is pregnant or has a virus due to feeling nauseated, diarrhea this am, breast tenderness, cramping, back pain. Negative preg test. No distress noted at triage.

## 2013-03-04 NOTE — MAU Note (Signed)
Patient states she has taken a home pregnancy test at home that was negative. Patient states she has sore breasts, back pain, diarrhea today x 3, nausea no vomiting. Denies bleeding but has a clear vaginal discharge, no odor.

## 2013-03-05 LAB — URINE CULTURE

## 2013-07-31 ENCOUNTER — Emergency Department (HOSPITAL_COMMUNITY)
Admission: EM | Admit: 2013-07-31 | Discharge: 2013-07-31 | Disposition: A | Discharge status: Home / Self Care | Payer: Medicaid Other | Attending: Emergency Medicine | Admitting: Emergency Medicine

## 2013-07-31 ENCOUNTER — Encounter (HOSPITAL_COMMUNITY): Payer: Self-pay | Admitting: Emergency Medicine

## 2013-07-31 DIAGNOSIS — I10 Essential (primary) hypertension: Secondary | ICD-10-CM | POA: Insufficient documentation

## 2013-07-31 DIAGNOSIS — Z202 Contact with and (suspected) exposure to infections with a predominantly sexual mode of transmission: Secondary | ICD-10-CM | POA: Insufficient documentation

## 2013-07-31 DIAGNOSIS — J45909 Unspecified asthma, uncomplicated: Secondary | ICD-10-CM | POA: Insufficient documentation

## 2013-07-31 DIAGNOSIS — F17200 Nicotine dependence, unspecified, uncomplicated: Secondary | ICD-10-CM | POA: Insufficient documentation

## 2013-07-31 DIAGNOSIS — Z8619 Personal history of other infectious and parasitic diseases: Secondary | ICD-10-CM | POA: Insufficient documentation

## 2013-07-31 DIAGNOSIS — Z8659 Personal history of other mental and behavioral disorders: Secondary | ICD-10-CM | POA: Insufficient documentation

## 2013-07-31 DIAGNOSIS — Z711 Person with feared health complaint in whom no diagnosis is made: Secondary | ICD-10-CM

## 2013-07-31 LAB — WET PREP, GENITAL
Clue Cells Wet Prep HPF POC: NONE SEEN
Trich, Wet Prep: NONE SEEN
Yeast Wet Prep HPF POC: NONE SEEN

## 2013-07-31 MED ORDER — CEFTRIAXONE SODIUM 250 MG IJ SOLR
250.0000 mg | Freq: Once | INTRAMUSCULAR | Status: AC
  Administered 2013-07-31: 250 mg via INTRAMUSCULAR
  Filled 2013-07-31: qty 250

## 2013-07-31 MED ORDER — AZITHROMYCIN 250 MG PO TABS
1000.0000 mg | ORAL_TABLET | Freq: Once | ORAL | Status: AC
  Administered 2013-07-31: 1000 mg via ORAL
  Filled 2013-07-31: qty 4

## 2013-07-31 NOTE — ED Notes (Signed)
Per Pt: Pt reports her boyfriend has been told that he is HIV+, pt is concerned that she has been exposed through sexual intercourse. Pt has no other complaints at this time. Pt is alertx4, NAD.

## 2013-07-31 NOTE — ED Provider Notes (Signed)
CSN: [id number]     Arrival date & time 07/31/13  1931 History  This chart was scribed for non-physician practitioner Johnnette Gourd, PA working with Richardean Canal, MD by Elveria Rising, ED Scribe. This patient was seen in room TR11C/TR11C and the patient's care was started at 8:05 PM.   Chief Complaint  Patient presents with  . Exposure to STD       The history is provided by the patient. No language interpreter was used.   HPI Comments: Rose Gutierrez is a 33 y.o. female who presents to the Emergency Department concerned of suspected STD exposure. Patient reports that she recently got a phone call and the caller informed her that her boyfriend may have been exposed to an STD through an ex girlfriend. She claims to have HIV, however and states this is highly unlikely and she just states it to "stir problems", also stating she has many STDs. Patient is concerned that she may have been exposed through sexual contact with him. Patient admits that the claim may be unfounded, but she desires an STI screening to confirm or deny infection. Patient is asymptomatic: denies vaginal discharge.  Past Medical History  Diagnosis Date  . Hypertension   . Asthma   . Anxiety    Past Surgical History  Procedure Laterality Date  . Cesearean section    . Cesarean section     Family History  Problem Relation Age of Onset  . Cancer Mother    History  Substance Use Topics  . Smoking status: Current Every Day Smoker -- 1.00 packs/day    Types: Cigarettes  . Smokeless tobacco: Not on file  . Alcohol Use: No   OB History   Grav Para Term Preterm Abortions TAB SAB Ect Mult Living   4 3 3  0 1 0 1 0 0 3     Review of Systems A complete 10 system review of systems was obtained and all systems are negative except as noted in the HPI and PMH.     Allergies  Review of patient's allergies indicates no known allergies.  Home Medications   No current outpatient prescriptions on file. Triage Vitals: BP  126/94  Pulse 92  Temp(Src) 98.7 F (37.1 C) (Oral)  Resp 20  SpO2 100%  Physical Exam  Nursing note and vitals reviewed. Constitutional: She is oriented to person, place, and time. She appears well-developed and well-nourished. No distress.  HENT:  Head: Normocephalic and atraumatic.  Mouth/Throat: Oropharynx is clear and moist.  Eyes: Conjunctivae and EOM are normal.  Neck: Normal range of motion. Neck supple.  Cardiovascular: Normal rate, regular rhythm and normal heart sounds.   Pulmonary/Chest: Effort normal and breath sounds normal. No respiratory distress.  Genitourinary: Vagina normal and uterus normal. There is no rash or tenderness on the right labia. There is no rash or tenderness on the left labia. Cervix exhibits no motion tenderness, no discharge and no friability. Right adnexum displays no mass, no tenderness and no fullness. Left adnexum displays no mass, no tenderness and no fullness. No erythema, tenderness or bleeding around the vagina. No vaginal discharge found.  Musculoskeletal: Normal range of motion. She exhibits no edema.  Neurological: She is alert and oriented to person, place, and time. No sensory deficit.  Skin: Skin is warm and dry.  Psychiatric: She has a normal mood and affect. Her behavior is normal.    ED Course  Procedures (including critical care time) DIAGNOSTIC STUDIES: Oxygen Saturation is 100% on room  air, normal by my interpretation.    COORDINATION OF CARE: 8:18 PM- Will conduct pelvic exam. Discussed treatment plan with patient at bedside and patient agreed to plan.     Labs Review Labs Reviewed  WET PREP, GENITAL - Abnormal; Notable for the following:    WBC, Wet Prep HPF POC MODERATE (*)    All other components within normal limits  GC/CHLAMYDIA PROBE AMP  RPR  HIV ANTIBODY (ROUTINE TESTING)   Imaging Review No results found.   EKG Interpretation None      MDM   Final diagnoses:  Concern about STD in female without  diagnosis    Patient presenting for STD check after ex-girlfriend of her boyfriend states she has STDs. She is well-appearing in no apparent distress on asymptomatic. STD panel obtained. Prophylactically treated for GC/chlamydia. Safe sexual practices discussed. Stable for discharge. Return precautions given. Patient states understanding of treatment care plan and is agreeable.  Pt requesting referral to GYN. [facility].  .scibe   Trevor MaceRobyn M Albert, PA-C 07/31/13 2157

## 2013-07-31 NOTE — ED Provider Notes (Signed)
Medical screening examination/treatment/procedure(s) were performed by non-physician practitioner and as supervising physician I was immediately available for consultation/collaboration.   EKG Interpretation None         David H Yao, MD 07/31/13 2313 

## 2013-07-31 NOTE — Discharge Instructions (Signed)
You were treated today for both gonorrhea and Chlamydia. If these tests results are positive, you will be informed and are obligated to inform your partner. You were also tested for syphilis and HIV, if these tests result positive, you will be informed and are obligated to inform your partner.

## 2013-08-01 LAB — RPR

## 2013-08-01 LAB — HIV ANTIBODY (ROUTINE TESTING W REFLEX): HIV 1&2 Ab, 4th Generation: NONREACTIVE

## 2013-08-02 LAB — GC/CHLAMYDIA PROBE AMP
CT Probe RNA: POSITIVE — AB
GC Probe RNA: NEGATIVE

## 2013-08-03 NOTE — ED Notes (Signed)
+   Chlamydia Patient treated with Rocephin And Zithromax-[facility] faxed 

## 2013-08-07 ENCOUNTER — Telehealth (HOSPITAL_BASED_OUTPATIENT_CLINIC_OR_DEPARTMENT_OTHER): Payer: Self-pay | Admitting: Emergency Medicine

## 2013-11-03 ENCOUNTER — Emergency Department (HOSPITAL_COMMUNITY)
Admission: EM | Admit: 2013-11-03 | Discharge: 2013-11-03 | Disposition: A | Discharge status: Home / Self Care | Payer: Medicaid Other | Attending: Emergency Medicine | Admitting: Emergency Medicine

## 2013-11-03 ENCOUNTER — Encounter (HOSPITAL_COMMUNITY): Payer: Self-pay | Admitting: Emergency Medicine

## 2013-11-03 DIAGNOSIS — J45909 Unspecified asthma, uncomplicated: Secondary | ICD-10-CM | POA: Insufficient documentation

## 2013-11-03 DIAGNOSIS — F17200 Nicotine dependence, unspecified, uncomplicated: Secondary | ICD-10-CM | POA: Diagnosis not present

## 2013-11-03 DIAGNOSIS — Z8659 Personal history of other mental and behavioral disorders: Secondary | ICD-10-CM | POA: Insufficient documentation

## 2013-11-03 DIAGNOSIS — Z3202 Encounter for pregnancy test, result negative: Secondary | ICD-10-CM | POA: Diagnosis not present

## 2013-11-03 DIAGNOSIS — R531 Weakness: Secondary | ICD-10-CM

## 2013-11-03 DIAGNOSIS — R5383 Other fatigue: Principal | ICD-10-CM

## 2013-11-03 DIAGNOSIS — I10 Essential (primary) hypertension: Secondary | ICD-10-CM | POA: Diagnosis not present

## 2013-11-03 DIAGNOSIS — R109 Unspecified abdominal pain: Secondary | ICD-10-CM | POA: Diagnosis not present

## 2013-11-03 DIAGNOSIS — R5381 Other malaise: Secondary | ICD-10-CM | POA: Diagnosis not present

## 2013-11-03 DIAGNOSIS — R42 Dizziness and giddiness: Secondary | ICD-10-CM | POA: Diagnosis not present

## 2013-11-03 HISTORY — DX: Sleep apnea, unspecified: G47.30

## 2013-11-03 LAB — CBC WITH DIFFERENTIAL/PLATELET
BASOS ABS: 0 10*3/uL (ref 0.0–0.1)
BASOS PCT: 0 % (ref 0–1)
Eosinophils Absolute: 0.1 10*3/uL (ref 0.0–0.7)
Eosinophils Relative: 2 % (ref 0–5)
HCT: 34.9 % — ABNORMAL LOW (ref 36.0–46.0)
Hemoglobin: 11.1 g/dL — ABNORMAL LOW (ref 12.0–15.0)
Lymphocytes Relative: 39 % (ref 12–46)
Lymphs Abs: 2.5 10*3/uL (ref 0.7–4.0)
MCH: 28.2 pg (ref 26.0–34.0)
MCHC: 31.8 g/dL (ref 30.0–36.0)
MCV: 88.6 fL (ref 78.0–100.0)
MONO ABS: 0.9 10*3/uL (ref 0.1–1.0)
Monocytes Relative: 13 % — ABNORMAL HIGH (ref 3–12)
NEUTROS PCT: 46 % (ref 43–77)
Neutro Abs: 2.9 10*3/uL (ref 1.7–7.7)
Platelets: 334 10*3/uL (ref 150–400)
RBC: 3.94 MIL/uL (ref 3.87–5.11)
RDW: 13.7 % (ref 11.5–15.5)
WBC: 6.4 10*3/uL (ref 4.0–10.5)

## 2013-11-03 LAB — POC URINE PREG, ED: Preg Test, Ur: NEGATIVE

## 2013-11-03 LAB — URINE MICROSCOPIC-ADD ON

## 2013-11-03 LAB — URINALYSIS, ROUTINE W REFLEX MICROSCOPIC
Bilirubin Urine: NEGATIVE
GLUCOSE, UA: NEGATIVE mg/dL
KETONES UR: NEGATIVE mg/dL
Leukocytes, UA: NEGATIVE
Nitrite: NEGATIVE
PH: 5 (ref 5.0–8.0)
PROTEIN: NEGATIVE mg/dL
Specific Gravity, Urine: 1.013 (ref 1.005–1.030)
Urobilinogen, UA: 0.2 mg/dL (ref 0.0–1.0)

## 2013-11-03 LAB — COMPREHENSIVE METABOLIC PANEL
ALBUMIN: 3.4 g/dL — AB (ref 3.5–5.2)
ALK PHOS: 60 U/L (ref 39–117)
ALT: 24 U/L (ref 0–35)
AST: 19 U/L (ref 0–37)
Anion gap: 11 (ref 5–15)
BILIRUBIN TOTAL: 0.2 mg/dL — AB (ref 0.3–1.2)
BUN: 8 mg/dL (ref 6–23)
CO2: 27 meq/L (ref 19–32)
CREATININE: 0.77 mg/dL (ref 0.50–1.10)
Calcium: 8.8 mg/dL (ref 8.4–10.5)
Chloride: 106 mEq/L (ref 96–112)
GFR calc Af Amer: >90 mL/min (ref >90)
Glucose, Bld: 96 mg/dL (ref 70–99)
POTASSIUM: 3.9 meq/L (ref 3.7–5.3)
SODIUM: 144 meq/L (ref 137–147)
Total Protein: 7.3 g/dL (ref 6.0–8.3)

## 2013-11-03 LAB — TSH: TSH: 1.87 u[IU]/mL (ref 0.350–4.500)

## 2013-11-03 NOTE — Discharge Instructions (Signed)
Your lab work today did not show any significant abnormalities. One of your tests is pending and if return abnormal  You will be called back. Return if symptoms are worsening. Try to eat healthy well balanced diet, drink plenty of fluids, get good sleep every night. Follow up with primary care doctor.    Fatigue Fatigue is a feeling of tiredness, lack of energy, lack of motivation, or feeling tired all the time. Having enough rest, good nutrition, and reducing stress will normally reduce fatigue. Consult your caregiver if it persists. The nature of your fatigue will help your caregiver to find out its cause. The treatment is based on the cause.  CAUSES  There are many causes for fatigue. Most of the time, fatigue can be traced to one or more of your habits or routines. Most causes fit into one or more of three general areas. They are: Lifestyle problems  Sleep disturbances.  Overwork.  Physical exertion.  Unhealthy habits.  Poor eating habits or eating disorders.  Alcohol and/or drug use .  Lack of proper nutrition (malnutrition). Psychological problems  Stress and/or anxiety problems.  Depression.  Grief.  Boredom. Medical Problems or Conditions  Anemia.  Pregnancy.  Thyroid gland problems.  Recovery from major surgery.  Continuous pain.  Emphysema or asthma that is not well controlled  Allergic conditions.  Diabetes.  Infections (such as mononucleosis).  Obesity.  Sleep disorders, such as sleep apnea.  Heart failure or other heart-related problems.  Cancer.  Kidney disease.  Liver disease.  Effects of certain medicines such as antihistamines, cough and cold remedies, prescription pain medicines, heart and blood pressure medicines, drugs used for treatment of cancer, and some antidepressants. SYMPTOMS  The symptoms of fatigue include:   Lack of energy.  Lack of drive (motivation).  Drowsiness.  Feeling of indifference to the  surroundings. DIAGNOSIS  The details of how you feel help guide your caregiver in finding out what is causing the fatigue. You will be asked about your present and past health condition. It is important to review all medicines that you take, including prescription and non-prescription items. A thorough exam will be done. You will be questioned about your feelings, habits, and normal lifestyle. Your caregiver may suggest blood tests, urine tests, or other tests to look for common medical causes of fatigue.  TREATMENT  Fatigue is treated by correcting the underlying cause. For example, if you have continuous pain or depression, treating these causes will improve how you feel. Similarly, adjusting the dose of certain medicines will help in reducing fatigue.  HOME CARE INSTRUCTIONS   Try to get the required amount of good sleep every night.  Eat a healthy and nutritious diet, and drink enough water throughout the day.  Practice ways of relaxing (including yoga or meditation).  Exercise regularly.  Make plans to change situations that cause stress. Act on those plans so that stresses decrease over time. Keep your work and personal routine reasonable.  Avoid street drugs and minimize use of alcohol.  Start taking a daily multivitamin after consulting your caregiver. SEEK MEDICAL CARE IF:   You have persistent tiredness, which cannot be accounted for.  You have fever.  You have unintentional weight loss.  You have headaches.  You have disturbed sleep throughout the night.  You are feeling sad.  You have constipation.  You have dry skin.  You have gained weight.  You are taking any new or different medicines that you suspect are causing fatigue.  You  are unable to sleep at night.  You develop any unusual swelling of your legs or other parts of your body. SEEK IMMEDIATE MEDICAL CARE IF:   You are feeling confused.  Your vision is blurred.  You feel faint or pass out.  You  develop severe headache.  You develop severe abdominal, pelvic, or back pain.  You develop chest pain, shortness of breath, or an irregular or fast heartbeat.  You are unable to pass a normal amount of urine.  You develop abnormal bleeding such as bleeding from the rectum or you vomit blood.  You have thoughts about harming yourself or committing suicide.  You are worried that you might harm someone else. MAKE SURE YOU:   Understand these instructions.  Will watch your condition.  Will get help right away if you are not doing well or get worse. Document Released: 02/03/2007 Document Revised: 07/01/2011 Document Reviewed: 02/03/2007 [facility] Patient Information 2015 [place], [place]. This information is not intended to replace advice given to you by your health care provider. Make sure you discuss any questions you have with your health care provider.   Emergency Department Resource Guide 1) Find a Doctor and Pay Out of Pocket Although you won't have to find out who is covered by your insurance plan, it is a good idea to ask around and get recommendations. You will then need to call the office and see if the doctor you have chosen will accept you as a new patient and what types of options they offer for patients who are self-pay. Some doctors offer discounts or will set up payment plans for their patients who do not have insurance, but you will need to ask so you aren't surprised when you get to your appointment.  2) Contact Your Local Health Department Not all health departments have doctors that can see patients for sick visits, but many do, so it is worth a call to see if yours does. If you don't know where your local health department is, you can check in your phone book. The CDC also has a tool to help you locate your state's health department, and many state websites also have listings of all of their local health departments.  3) Find a Walk-in Clinic If your illness is not  likely to be very severe or complicated, you may want to try a walk in clinic. These are popping up all over the country in pharmacies, drugstores, and shopping centers. They're usually staffed by nurse practitioners or physician assistants that have been trained to treat common illnesses and complaints. They're usually fairly quick and inexpensive. However, if you have serious medical issues or chronic medical problems, these are probably not your best option.  No Primary Care Doctor: - Call Health Connect at  [phone] - they can help you locate a primary care doctor that  accepts your insurance, provides certain services, etc. - Physician Referral Service- [phone]  Chronic Pain Problems: Organization         Address  Phone   Notes  [facility]  [phone] Patients need to be referred by their primary care doctor.   Medication Assistance: Organization         Address  Phone   Notes  [facility] Medication [facility] [address] [place] [place], [place] [address] [phone] --Must be a resident of [facility] -- Must have NO insurance coverage whatsoever (no Medicaid/ Medicare, etc.) -- The pt. MUST have a primary care  doctor that directs their care regularly and follows them in the community   [facility]  [phone]   [facility]  [phone]    Agencies that provide inexpensive medical care: Organization         Address  Phone   Notes  [facility]  [phone]   [facility]    [phone]   [facility] [address] [place], [place] [address] [phone]   [facility] [address] [place] [place] [phone]   Planned Parenthood    [phone]   [facility]    [phone]   [facility] and [facility]  [address], [place] Phone:  [phone], Fax:  [phone] Hours of Operation:  9 am - 6 pm,  M-F.  Also accepts Medicaid/Medicare and self-pay.  [facility]  [address], [place] Phone: [phone], Fax: [phone]. Hours of Operation:  8:30 am - 5:30 pm, M-F.  Also accepts Medicaid and self-pay.  [facility] [place] [address] [place] Phone: [phone]   [facility] [address] Natasha Bence [place], [place] [phone], Ext. [phone] Mondays & Thursdays: 7-9 AM.  First 15 patients are seen on a first come, first serve basis.    Medicaid-accepting [facility] Providers:  Organization         Address  Phone   Notes  [facility] [address],  [phone] Also accepts self-pay patients.  [facility] [address] Laurell Josephs [place], [place]  [phone]   [facility] [address], [place] [phone]   [facility] [address], [place] [phone]   Renaye Rakers [address], [place]   [phone] Only accepts [place] Access [place] patients after they have their name applied to their card.   Self-Pay (no insurance) in [facility]:  Organization         Address  Phone   Notes  Sickle Cell Patients, [facility] [address] [place], [place] [phone]   [facility] [address] [place], [place] [phone]   [facility] [place]  [address], [place] [phone]   [facility]/Dr. Osei-Bonsu  [address] [place] or [address], [place] [phone] Phone number for both [place] and [place] locations is the same.  [facility] [address], [place] [phone]   [facility] [address] [place] or [address] [phone]   [facility] [address], [place] [phone], phone; [phone], fax Sees patients 1st and 3rd Saturday of every month.  Must not qualify for public or private insurance (i.e. Medicaid, Medicare, [place] Health Choice, Veterans' Benefits)  Household income should be no more than 200% of the poverty level The clinic cannot treat you if you are pregnant or think you are pregnant  Sexually transmitted diseases are not treated at the clinic.    Dental Care: Organization         Address  Phone  Notes  [facility] [address] [place], [place] [phone] Accepts  children up to age 521 who are enrolled in Medicaid or [place] Health Choice; pregnant women with a Medicaid card; and children who have applied for Medicaid or [place] Health Choice, but were declined, whose parents can pay a reduced fee at time of service.  [facility] [place]  [address], [place] [phone] Accepts children up to age 31 who are enrolled in [place] or [place] Health Choice; pregnant women with a Medicaid card; and children who have applied for Medicaid or [place] Health Choice, but were declined, whose parents can pay a reduced fee at time of service.  [facility]  [address], [place] [phone] Patients are seen by appointment only. Walk-ins are not accepted. [facility] will see patients 31 years of age and older. Monday - Tuesday (8am-5pm) Most Wednesdays (8:30-5pm) $30 per visit, cash only  [facility]  [address], [facility] [place] [phone] Patients are seen by appointment only. Walk-ins are not accepted. [facility] will see patients 31 years of age and older. One Wednesday Evening (Monthly: Volunteer Based).  $30 per visit, cash only  [facility]  [phone] for adults; Children under age 264, call Graduate Pediatric Dentistry at [phone])  [phone]. Children aged 384-14, please call [phone] to request a pediatric application.  Dental services are provided in all areas of dental care including fillings, crowns and bridges, complete and partial dentures, implants, gum treatment, root canals, and extractions. Preventive care is also provided. Treatment is provided to both adults and children. Patients are selected via a lottery and there is often a waiting list.   [facility] [address], [place]  [phone]) [phone] [email or web address]   [facility] [address], [place], [place] [phone], Ext. [phone] Second and Fourth Thursday of each month, opens at 6:30 AM; Clinic ends at 9 AM.  Patients are seen on a first-come first-served basis, and a limited number are seen during each clinic.   [facility]  [address], [place], [place] [phone]) [phone]   Eligibility Requirements You must have lived in [place], [place], or [place] counties for at least the last three months.   You cannot be eligible for state or federal sponsored [place]healthcare insurance, including CIGNAVeterans Administration, [place]icaid, or [employer]icare.   You generally cannot be eligible for healthcare insurance through your employer.    How to apply: Eligibility screenings are held every Tuesday and Wednesday afternoon from 1:00 pm until 4:00 pm. You do not need an appointment for the interview!  [facility] [address], [place], [place] [phone]   [facility]  [phone]   [facility]  [phone]   [facility]  [phone]    Behavioral Health Resources in the Community: Intensive Outpatient Programs Organization         Address  Phone  Notes  [facility] [address], [place], [place] [phone]   [facility] [address], [place], [place] [phone]   [facility]  [address], [place], [place]  [phone]   [facility] [address],  [place], [place] [phone] or [phone]   Substance Abuse Resources Organization         Address  Phone  Notes  [facility]  [phone]  [facility]  [phone]   [facility]  [phone]   Floydene Flock  [phone]   Residential & Outpatient Substance Abuse Program  [phone]   Psychological Services Organization         Address  Phone  Notes  [facility] Health  [phone]   [facility]  [phone]   [facility] Mental Health [address], [place] [phone] or [phone]    Mobile Crisis Teams Organization         Address  Phone  Notes  [facility], Mobile Crisis Care Unit  [phone]   [facility]  [address] [place], [place] [phone]   Doristine Locks [address] [place] [phone]    Self-Help/Support Groups Organization         Address  Phone             Notes  [facility] [place] - variety of support groups  336- I7437963 Call for more information  Narcotics Anonymous (NA), [facility] [address], [employer] [place]  2 meetings at this location   Statistician         Address  Phone  Notes  [facility] [address],    [place]  [phone]   [facility]  [address] [place] [address], [place], [place] [phone]   [facility] [address] [place], [place] [phone] Admissions: 8am-3pm M-F  [facility] [address]    [place], [place] [phone]   [facility] [address] [place], [place], [place] [phone]   [facility] [address],  [place], [place] [phone]   [facility] - Intensive Outpatient [address] Laurell Josephs [address], [place], [place]  [phone]   [facility] ([facility] [address] [place]  [place], [place] [phone] or [phone]   [facility] ([facility]) [address], [place], [place] [phone] Accepts Medicaid  [facility] [address],  [place] [phone] Substance Abuse/Addiction Treatment   [facility] Organization         Address  Phone  Notes  [facility]  [phone]   Angie Fava, PhD [address] Ervin Knack [place], [place]   [phone] or [phone]   [facility]   [address] [place], [place] [phone]   [facility] [address] [place], [place] [phone] Insurance/Medicaid/sponsorship through [facility] and Families [address]                                    [place], [place] [phone] Therapy/tele-psych/case  [facility] [address], [place] [phone]    Dr. Lolly Mustache  [phone]   [facility]  [employer] [facility] Dept. 1) [address], [place] 2) [address], [place] 3)  [address], [place] [phone]  [phone]   [facility] Child Abuse Hotline [phone])128-4039 or [phone] (After Hours)

## 2013-11-03 NOTE — ED Provider Notes (Signed)
Medical screening examination/treatment/procedure(s) were conducted as a shared visit with non-physician practitioner(s) and myself.  I personally evaluated the patient during the encounter.  Pt c/o mild nausea, feels tired/gen weak.  No focal numbness/weakness. No severe headaches. No chest pain or discomfort. No sob or unusual doe. No abd pain. Chest cta. abd soft nt. Labs.   Suzi RootsKevin E Xiana Carns, MD 11/03/13 1215

## 2013-11-03 NOTE — ED Notes (Signed)
Spoke with PA and called lab to inquire how long TSH will take to be resulted.  States one day turn around [place] notified.

## 2013-11-03 NOTE — ED Notes (Signed)
PA at bedside.

## 2013-11-03 NOTE — ED Notes (Addendum)
Disregard departure condition. Mischarted.

## 2013-11-03 NOTE — ED Notes (Addendum)
Pt states she has been feeling "not herself" on and off for the past week. Pt states she feels more worn down than usual and tires easily. She also c/o right lower quadrant pain throughout the past week but not currently she also mentions increased urination. She is concerned she may be diabetic due to her symptoms. Pt NAD at this time.

## 2013-11-03 NOTE — ED Provider Notes (Signed)
CSN: [id number]     Arrival date & time 11/03/13  4098 History   First MD Initiated Contact with Patient 11/03/13 [phone]     Chief Complaint  Patient presents with  . Fatigue  . Weakness     (Consider location/radiation/quality/duration/timing/severity/associated sxs/prior Treatment) HPI Rose Gutierrez is a 33 y.o. female who presents to emergency department complaining of fatigue, weakness, dizziness, right flank pain. Patient states her symptoms began several weeks ago. States they're not improving. Reports she's and a normal amount of stress at home, has 3 kids, at times not getting enough sleep, but states that has been like that for several years. She reports that she does not have as much energy as she has had in the past. States she gets tired quickly when she's doing certain tasks. States she's sleeping and laying down more than normal. She states she has intermittent pain in her right side. She does report urinary frequency. She denies any dysuria urinary urgency, hematuria. She denies any fevers, chills, chest pain, shortness of breath, abdominal pain. She does report heavy initial cycles. She does not have a primary care Dr. has not had her blood checked in several years. Patient states she's concerned she may be diabetic. Eating and drinking well. Did not try any treatment for this.     Past Medical History  Diagnosis Date  . Hypertension   . Asthma   . Anxiety   . Sleep apnea    Past Surgical History  Procedure Laterality Date  . Cesearean section    . Cesarean section     Family History  Problem Relation Age of Onset  . Cancer Mother    History  Substance Use Topics  . Smoking status: Current Every Day Smoker -- 0.50 packs/day    Types: Cigarettes  . Smokeless tobacco: Not on file  . Alcohol Use: Yes     Comment: occasionally    OB History   Grav Para Term Preterm Abortions TAB SAB Ect Mult Living   4 3 3  0 1 0 1 0 0 3     Review of Systems  Constitutional:  Positive for fatigue. Negative for fever and chills.  Respiratory: Negative for cough, chest tightness and shortness of breath.   Cardiovascular: Negative for chest pain, palpitations and leg swelling.  Gastrointestinal: Negative for nausea, vomiting, abdominal pain and diarrhea.  Genitourinary: Positive for frequency. Negative for dysuria, hematuria, flank pain, vaginal bleeding, vaginal discharge, difficulty urinating, vaginal pain and pelvic pain.  Musculoskeletal: Negative for arthralgias, myalgias, neck pain and neck stiffness.  Skin: Negative for rash.  Neurological: Positive for weakness and headaches. Negative for dizziness, light-headedness and numbness.  All other systems reviewed and are negative.     Allergies  Review of patient's allergies indicates no known allergies.  Home Medications   Prior to Admission medications   Not on File   BP 121/81  Pulse 66  Temp(Src) 99.2 F (37.3 C) (Oral)  Resp 20  SpO2 100% Physical Exam  Nursing note and vitals reviewed. Constitutional: She is oriented to person, place, and time. She appears well-developed and well-nourished. No distress.  HENT:  Head: Normocephalic.  Mouth/Throat: Oropharynx is clear and moist.  Eyes: Conjunctivae are normal.  Neck: Neck supple.  Cardiovascular: Normal rate, regular rhythm and normal heart sounds.   Pulmonary/Chest: Effort normal and breath sounds normal. No respiratory distress. She has no wheezes. She has no rales.  Abdominal: Soft. Bowel sounds are normal. She exhibits no distension. There is  no tenderness. There is no rebound.  No CVA tenderness  Musculoskeletal: She exhibits no edema.  Neurological: She is alert and oriented to person, place, and time.  Skin: Skin is warm and dry.  Psychiatric: She has a normal mood and affect. Her behavior is normal.    ED Course  Procedures (including critical care time) Labs Review Labs Reviewed  CBC WITH DIFFERENTIAL - Abnormal; Notable for  the following:    Hemoglobin 11.1 (*)    HCT 34.9 (*)    Monocytes Relative 13 (*)    All other components within normal limits  COMPREHENSIVE METABOLIC PANEL - Abnormal; Notable for the following:    Albumin 3.4 (*)    Total Bilirubin 0.2 (*)    All other components within normal limits  URINALYSIS, ROUTINE W REFLEX MICROSCOPIC - Abnormal; Notable for the following:    Hgb urine dipstick MODERATE (*)    All other components within normal limits  URINE MICROSCOPIC-ADD ON - Abnormal; Notable for the following:    Squamous Epithelial / LPF FEW (*)    All other components within normal limits  TSH  POC URINE PREG, ED    Imaging Review No results found.   EKG Interpretation None      MDM   Final diagnoses:  Weakness    8:18 AM Pt seen and examined. Pt reports generalized weakness, fatigue, "not feeling like myself." Symptoms for several weeks. Will check labs to evaluate for possible anemia, hyperglycemia, electrolyte abnormality. Will get TSH to check for thyroid dysfunction. UA and urine pregnancy. Her VS are all within normal. She does not appear to be acutely ill.      11:27 AM TSH machine broken, delay in results. Patient's labs otherwise unremarkable. Her electrolytes including kidney function, glucose, potassium are normal. She is mildly anemic, but unchanged from last year. Her urinalysis is unremarkable except for a moderate hemoglobin, she is currently on her menstrual cycle. She has no specific complaints, no further testing indicated at this time and emergency department. Her TSH is in process. Urine pregnancy was negative. Vital signs are normal. Will discharge home with outpatient followup. Instructed to eat healthy, well balanced meals, and make sure to drink plenty of fluids, get plenty of rest. Patient agreed and voiced understanding  Filed Vitals:   11/03/13 0944 11/03/13 1000 11/03/13 1030 11/03/13 1100  BP: 112/55 114/55 112/42 116/80  Pulse: 72 62 63 64   Temp:      TempSrc:      Resp: 16 16 16 16   SpO2: 100% 100% 99% 100%     Lottie Musselatyana A Ceclia Koker, PA-C 11/03/13 1129

## 2014-02-21 ENCOUNTER — Encounter (HOSPITAL_COMMUNITY): Payer: Self-pay | Admitting: Emergency Medicine

## 2014-03-19 ENCOUNTER — Encounter (HOSPITAL_COMMUNITY): Payer: Self-pay | Admitting: *Deleted

## 2014-03-19 ENCOUNTER — Inpatient Hospital Stay (HOSPITAL_COMMUNITY): Payer: Medicaid Other

## 2014-03-19 ENCOUNTER — Inpatient Hospital Stay (HOSPITAL_COMMUNITY)
Admission: AD | Admit: 2014-03-19 | Discharge: 2014-03-19 | Disposition: A | Discharge status: Home / Self Care | Payer: Medicaid Other | Source: Ambulatory Visit | Attending: Obstetrics & Gynecology | Admitting: Obstetrics & Gynecology

## 2014-03-19 DIAGNOSIS — N939 Abnormal uterine and vaginal bleeding, unspecified: Secondary | ICD-10-CM | POA: Diagnosis not present

## 2014-03-19 DIAGNOSIS — B9689 Other specified bacterial agents as the cause of diseases classified elsewhere: Secondary | ICD-10-CM | POA: Insufficient documentation

## 2014-03-19 DIAGNOSIS — N760 Acute vaginitis: Secondary | ICD-10-CM | POA: Diagnosis not present

## 2014-03-19 DIAGNOSIS — E669 Obesity, unspecified: Secondary | ICD-10-CM | POA: Insufficient documentation

## 2014-03-19 DIAGNOSIS — N9089 Other specified noninflammatory disorders of vulva and perineum: Secondary | ICD-10-CM | POA: Diagnosis not present

## 2014-03-19 DIAGNOSIS — IMO0001 Reserved for inherently not codable concepts without codable children: Secondary | ICD-10-CM

## 2014-03-19 DIAGNOSIS — A499 Bacterial infection, unspecified: Secondary | ICD-10-CM

## 2014-03-19 DIAGNOSIS — R030 Elevated blood-pressure reading, without diagnosis of hypertension: Secondary | ICD-10-CM

## 2014-03-19 DIAGNOSIS — N949 Unspecified condition associated with female genital organs and menstrual cycle: Secondary | ICD-10-CM

## 2014-03-19 LAB — WET PREP, GENITAL
TRICH WET PREP: NONE SEEN
Yeast Wet Prep HPF POC: NONE SEEN

## 2014-03-19 LAB — URINALYSIS, ROUTINE W REFLEX MICROSCOPIC
Bilirubin Urine: NEGATIVE
Glucose, UA: NEGATIVE mg/dL
Ketones, ur: NEGATIVE mg/dL
LEUKOCYTES UA: NEGATIVE
Nitrite: NEGATIVE
PROTEIN: NEGATIVE mg/dL
Specific Gravity, Urine: >1.03 — ABNORMAL HIGH (ref 1.005–1.030)
UROBILINOGEN UA: 0.2 mg/dL (ref 0.0–1.0)
pH: 6 (ref 5.0–8.0)

## 2014-03-19 LAB — POCT PREGNANCY, URINE: Preg Test, Ur: NEGATIVE

## 2014-03-19 LAB — URINE MICROSCOPIC-ADD ON

## 2014-03-19 MED ORDER — METRONIDAZOLE 500 MG PO TABS: 500.0000 mg | ORAL_TABLET | Freq: Two times a day (BID) | ORAL | Status: AC

## 2014-03-19 NOTE — MAU Note (Signed)
Pt presents to MAU with complaints of irregular menstrual cycles and vaginal bleeding with intercourse.

## 2014-03-19 NOTE — Discharge Instructions (Signed)
Abnormal Uterine Bleeding Abnormal uterine bleeding can affect women at various stages in life, including teenagers, women in their reproductive years, pregnant women, and women who have reached menopause. Several kinds of uterine bleeding are considered abnormal, including:  Bleeding or spotting between periods.   Bleeding after sexual intercourse.   Bleeding that is heavier or more than normal.   Periods that last longer than usual.  Bleeding after menopause.  Many cases of abnormal uterine bleeding are minor and simple to treat, while others are more serious. Any type of abnormal bleeding should be evaluated by your health care provider. Treatment will depend on the cause of the bleeding. HOME CARE INSTRUCTIONS Monitor your condition for any changes. The following actions may help to alleviate any discomfort you are experiencing:  Avoid the use of tampons and douches as directed by your health care provider.  Change your pads frequently. You should get regular pelvic exams and Pap tests. Keep all follow-up appointments for diagnostic tests as directed by your health care provider.  SEEK MEDICAL CARE IF:   Your bleeding lasts more than 1 week.   You feel dizzy at times.  SEEK IMMEDIATE MEDICAL CARE IF:   You pass out.   You are changing pads every 15 to 30 minutes.   You have abdominal pain.  You have a fever.   You become sweaty or weak.   You are passing large blood clots from the vagina.   You start to feel nauseous and vomit. MAKE SURE YOU:   Understand these instructions.  Will watch your condition.  Will get help right away if you are not doing well or get worse. Document Released: 04/08/2005 Document Revised: 04/13/2013 Document Reviewed: 11/05/2012 ExitCare Patient Information 2015 [employer]. This information is not intended to replace advice given to you by your health care provider. Make sure you discuss any questions you have with your  health care provider.  

## 2014-03-19 NOTE — MAU Provider Note (Signed)
CC: Vaginal Bleeding    First Provider Initiated Contact with Patient 03/19/14 1703      HPI Rose Gutierrez is a 33 y.o. [id number] who presents with 2 month history of postcoital BRB "gushing out" blood and irregular menstrual pattern. States menses regular 23rd each month until 2 months ago. Last VB episode 2 days ago. Also having vaginal discharge that irritates at times but not at present. Dysparunia at times. Same sexual partner x 1 year but unsure if he has been monogamous. Abnormal bleeding last 2 months. Right labial mass present and same size for about a year, nontender and never drains. No contraception and wants to get pregnant. LMP 02/28/14. Last Pap about 6 yrs ago. Hx chlamydia. Hx HTN on HCTZ, but discontinued due to normalized BP. Needs to establish GYN and primary care.  Past Medical History  Diagnosis Date  . Hypertension   . Asthma   . Anxiety   . Sleep apnea     OB History  Gravida Para Term Preterm AB SAB TAB Ectopic Multiple Living  4 3 3  0 1 1 0 0 0 3    # Outcome Date GA Lbr Len/2nd Weight Sex Delivery Anes PTL Lv  4 SAB           3 Term           2 Term           1 Term               Past Surgical History  Procedure Laterality Date  . Cesearean section    . Cesarean section      History   Social History  . Marital Status: Single    Spouse Name: N/A    Number of Children: N/A  . Years of Education: N/A   Occupational History  . Not on file.   Social History Main Topics  . Smoking status: Current Every Day Smoker -- 0.50 packs/day    Types: Cigarettes  . Smokeless tobacco: Not on file  . Alcohol Use: Yes     Comment: occasionally   . Drug Use: No  . Sexual Activity: Yes   Other Topics Concern  . Not on file   Social History Narrative    No current facility-administered medications on file prior to encounter.   No current outpatient prescriptions on file prior to encounter.    No Known Allergies  ROS Pertinent items in  HPI  PHYSICAL EXAM Filed Vitals:   03/19/14 1653  BP: 144/88  Pulse: 72  Temp: 98.2 F (36.8 C)  Resp: 18   General: Morbidly obese female in no acute distress Cardiovascular: Normal rate Respiratory: Normal effort Abdomen: Soft, nontender Back: No CVAT Extremities: No edema Neurologic: Alert and oriented Speculum exam: 4 cm soft nontender subcutaneous mass outer right labium majorus; vagina with bubbly discharge, no blood; cervix clean  Bimanual exam: cervix closed, no CMT; uterus unable to outline due to body habitus; no adnexal tenderness or masses noted   LAB RESULTS Results for orders placed or performed during the hospital encounter of 03/19/14 (from the past 24 hour(s))  Urinalysis, Routine w reflex microscopic     Status: Abnormal   Collection Time: 03/19/14  4:40 PM  Result Value Ref Range   Color, Urine YELLOW YELLOW   APPearance CLEAR CLEAR   Specific Gravity, Urine >1.030 (H) 1.005 - 1.030   pH 6.0 5.0 - 8.0   Glucose, UA NEGATIVE NEGATIVE mg/dL   Hgb  urine dipstick LARGE (A) NEGATIVE   Bilirubin Urine NEGATIVE NEGATIVE   Ketones, ur NEGATIVE NEGATIVE mg/dL   Protein, ur NEGATIVE NEGATIVE mg/dL   Urobilinogen, UA 0.2 0.0 - 1.0 mg/dL   Nitrite NEGATIVE NEGATIVE   Leukocytes, UA NEGATIVE NEGATIVE  Urine microscopic-add on     Status: Abnormal   Collection Time: 03/19/14  4:40 PM  Result Value Ref Range   Squamous Epithelial / LPF MANY (A) RARE   WBC, UA 3-6 <3 WBC/hpf   RBC / HPF 7-10 <3 RBC/hpf   Urine-Other MUCOUS PRESENT   Pregnancy, urine POC     Status: None   Collection Time: 03/19/14  4:47 PM  Result Value Ref Range   Preg Test, Ur NEGATIVE NEGATIVE  Wet prep, genital     Status: Abnormal   Collection Time: 03/19/14  5:15 PM  Result Value Ref Range   Yeast Wet Prep HPF POC NONE SEEN NONE SEEN   Trich, Wet Prep NONE SEEN NONE SEEN   Clue Cells Wet Prep HPF POC MODERATE (A) NONE SEEN   WBC, Wet Prep HPF POC FEW (A) NONE SEEN    IMAGING No  results found. Prelim US report: essentially unremarkabler except ES 14.3 mm  MAU COURSE GC. CT. HIV sent  ASSESSMENT  1. Abnormal vaginal bleeding   2. Obesity   3. Mass of labium   4. Elevated BP   5. BV (bacterial vaginosis)     PLAN Discharge home. See AVS for patient education.    Medication List    TAKE these medications        metroNIDAZOLE 500 MG tablet  Commonly known as:  FLAGYL  Take 1 tablet (500 mg total) by mouth 2 (two) times daily.     sertraline 100 MG tablet  Commonly known as:  ZOLOFT  Take 100 mg by mouth daily.       Follow-up Information    Follow up with [facility] In 4 weeks.   Why:  Someone from Clinic will call you with appt.   Contact information:   [address] [place] [id number] [phone]        Danae OrleansDeirdre C Kathrina Crosley, CNM 03/19/2014 5:07 PM

## 2014-03-20 LAB — HIV ANTIBODY (ROUTINE TESTING W REFLEX): HIV 1&2 Ab, 4th Generation: NONREACTIVE

## 2014-03-22 LAB — GC/CHLAMYDIA PROBE AMP
CT Probe RNA: NEGATIVE
GC Probe RNA: NEGATIVE

## 2014-04-29 ENCOUNTER — Encounter: Payer: Self-pay | Admitting: Nurse Practitioner

## 2014-04-29 ENCOUNTER — Other Ambulatory Visit (HOSPITAL_COMMUNITY)
Admission: RE | Admit: 2014-04-29 | Discharge: 2014-04-29 | Disposition: A | Discharge status: Home / Self Care | Payer: Medicaid Other | Source: Ambulatory Visit | Attending: Nurse Practitioner | Admitting: Nurse Practitioner

## 2014-04-29 ENCOUNTER — Ambulatory Visit (INDEPENDENT_AMBULATORY_CARE_PROVIDER_SITE_OTHER): Payer: Medicaid Other | Admitting: Nurse Practitioner

## 2014-04-29 VITALS — BP 126/78 | HR 70 | Temp 97.9°F | Ht 64.0 in | Wt 270.0 lb

## 2014-04-29 DIAGNOSIS — N907 Vulvar cyst: Secondary | ICD-10-CM | POA: Diagnosis not present

## 2014-04-29 DIAGNOSIS — Z01419 Encounter for gynecological examination (general) (routine) without abnormal findings: Secondary | ICD-10-CM

## 2014-04-29 DIAGNOSIS — Z1151 Encounter for screening for human papillomavirus (HPV): Secondary | ICD-10-CM | POA: Diagnosis present

## 2014-04-29 DIAGNOSIS — Z124 Encounter for screening for malignant neoplasm of cervix: Secondary | ICD-10-CM

## 2014-04-29 DIAGNOSIS — I10 Essential (primary) hypertension: Secondary | ICD-10-CM

## 2014-04-29 DIAGNOSIS — Z1272 Encounter for screening for malignant neoplasm of vagina: Secondary | ICD-10-CM

## 2014-04-29 MED ORDER — HYDROCHLOROTHIAZIDE 25 MG PO TABS: 25.0000 mg | ORAL_TABLET | Freq: Every day | ORAL | Status: DC

## 2014-04-29 NOTE — Patient Instructions (Signed)
Obesity Obesity is defined as having too much total body fat and a body mass index (BMI) of 30 or more. BMI is an estimate of body fat and is calculated from your height and weight. Obesity happens when you consume more calories than you can burn by exercising or performing daily physical tasks. Prolonged obesity can cause major illnesses or emergencies, such as:   Stroke.  Heart disease.  Diabetes.  Cancer.  Arthritis.  High blood pressure (hypertension).  High cholesterol.  Sleep apnea.  Erectile dysfunction.  Infertility problems. CAUSES   Regularly eating unhealthy foods.  Physical inactivity.  Certain disorders, such as an underactive thyroid (hypothyroidism), Cushing's syndrome, and polycystic ovarian syndrome.  Certain medicines, such as steroids, some depression medicines, and antipsychotics.  Genetics.  Lack of sleep. DIAGNOSIS  A health care provider can diagnose obesity after calculating your BMI. Obesity will be diagnosed if your BMI is 30 or higher.  There are other methods of measuring obesity levels. Some other methods include measuring your skinfold thickness, your waist circumference, and comparing your hip circumference to your waist circumference. TREATMENT  A healthy treatment program includes some or all of the following:  Long-term dietary changes.  Exercise and physical activity.  Behavioral and lifestyle changes.  Medicine only under the supervision of your health care provider. Medicines may help, but only if they are used with diet and exercise programs. An unhealthy treatment program includes:  Fasting.  Fad diets.  Supplements and drugs. These choices do not succeed in long-term weight control.  HOME CARE INSTRUCTIONS   Exercise and perform physical activity as directed by your health care provider. To increase physical activity, try the following:  Use stairs instead of elevators.  Park farther away from store  entrances.  Garden, bike, or walk instead of watching television or using the computer.  Eat healthy, low-calorie foods and drinks on a regular basis. Eat more fruits and vegetables. Use low-calorie cookbooks or take healthy cooking classes.  Limit fast food, sweets, and processed snack foods.  Eat smaller portions.  Keep a daily journal of everything you eat. There are many free websites to help you with this. It may be helpful to measure your foods so you can determine if you are eating the correct portion sizes.  Avoid drinking alcohol. Drink more water and drinks without calories.  Take vitamins and supplements only as recommended by your health care provider.  Weight-loss support groups, registered dietitians, counselors, and stress reduction education can also be very helpful. SEEK IMMEDIATE MEDICAL CARE IF:  You have chest pain or tightness.  You have trouble breathing or feel short of breath.  You have weakness or leg numbness.  You feel confused or have trouble talking.  You have sudden changes in your vision. MAKE SURE YOU:  Understand these instructions.  Will watch your condition.  Will get help right away if you are not doing well or get worse. Document Released: 05/16/2004 Document Revised: 08/23/2013 Document Reviewed: 05/15/2011 ExitCare Patient Information 2015 [employer]. This information is not intended to replace advice given to you by your health care provider. Make sure you discuss any questions you have with your health care provider. Hypertension Hypertension, commonly called high blood pressure, is when the force of blood pumping through your arteries is too strong. Your arteries are the blood vessels that carry blood from your heart throughout your body. A blood pressure reading consists of a higher number over a lower number, such as 110/72. The higher   number (systolic) is the pressure inside your arteries when your heart pumps. The lower number  (diastolic) is the pressure inside your arteries when your heart relaxes. Ideally you want your blood pressure below 120/80. Hypertension forces your heart to work harder to pump blood. Your arteries may become narrow or stiff. Having hypertension puts you at risk for heart disease, stroke, and other problems.  RISK FACTORS Some risk factors for high blood pressure are controllable. Others are not.  Risk factors you cannot control include:   Race. You may be at higher risk if you are African American.  Age. Risk increases with age.  Gender. Men are at higher risk than women before age 45 years. After age 65, women are at higher risk than men. Risk factors you can control include:  Not getting enough exercise or physical activity.  Being overweight.  Getting too much fat, sugar, calories, or salt in your diet.  Drinking too much alcohol. SIGNS AND SYMPTOMS Hypertension does not usually cause signs or symptoms. Extremely high blood pressure (hypertensive crisis) may cause headache, anxiety, shortness of breath, and nosebleed. DIAGNOSIS  To check if you have hypertension, your health care provider will measure your blood pressure while you are seated, with your arm held at the level of your heart. It should be measured at least twice using the same arm. Certain conditions can cause a difference in blood pressure between your right and left arms. A blood pressure reading that is higher than normal on one occasion does not mean that you need treatment. If one blood pressure reading is high, ask your health care provider about having it checked again. TREATMENT  Treating high blood pressure includes making lifestyle changes and possibly taking medicine. Living a healthy lifestyle can help lower high blood pressure. You may need to change some of your habits. Lifestyle changes may include:  Following the DASH diet. This diet is high in fruits, vegetables, and whole grains. It is low in salt, red  meat, and added sugars.  Getting at least 2 hours of brisk physical activity every week.  Losing weight if necessary.  Not smoking.  Limiting alcoholic beverages.  Learning ways to reduce stress. If lifestyle changes are not enough to get your blood pressure under control, your health care provider may prescribe medicine. You may need to take more than one. Work closely with your health care provider to understand the risks and benefits. HOME CARE INSTRUCTIONS  Have your blood pressure rechecked as directed by your health care provider.   Take medicines only as directed by your health care provider. Follow the directions carefully. Blood pressure medicines must be taken as prescribed. The medicine does not work as well when you skip doses. Skipping doses also puts you at risk for problems.   Do not smoke.   Monitor your blood pressure at home as directed by your health care provider. SEEK MEDICAL CARE IF:   You think you are having a reaction to medicines taken.  You have recurrent headaches or feel dizzy.  You have swelling in your ankles.  You have trouble with your vision. SEEK IMMEDIATE MEDICAL CARE IF:  You develop a severe headache or confusion.  You have unusual weakness, numbness, or feel faint.  You have severe chest or abdominal pain.  You vomit repeatedly.  You have trouble breathing. MAKE SURE YOU:   Understand these instructions.  Will watch your condition.  Will get help right away if you are not doing well or   get worse. Document Released: 04/08/2005 Document Revised: 08/23/2013 Document Reviewed: 01/29/2013 ExitCare Patient Information 2015 [employer]. This information is not intended to replace advice given to you by your health care provider. Make sure you discuss any questions you have with your health care provider.  

## 2014-04-29 NOTE — Progress Notes (Signed)
History:  Rose Homesicole Williamsis a 34 y.o. [id number] who [facility] several issues including irregular vaginal bleeding, a labial cyst, control of hypertension and obesity. She was seen in MAU and advised to establish primary and GYN care. She has her irregular bleeding after intercourse only. She had her last pap 6 years ago and it was normal. She has never had an abnormal pap. She has same sexual partner x 1 year. She is currently off birth control and plans pregnancy. She is off her HCTZ due to lack of prescription and would like to restart. Her labial cyst has been there since she was 34 years old and is getting bigger. She would like to have it removed  The following portions of the patient's history were reviewed and updated as appropriate: allergies, current medications, past family history, past medical history, past social history, past surgical history and problem list.  Review of Systems:    Objective:  Physical Exam BP 126/78 mmHg  Pulse 70  Temp(Src) 97.9 F (36.6 C) (Oral)  Ht 5\' 4"  (1.626 m)  Wt 270 lb (122.471 kg)  BMI 46.32 kg/m2  LMP 04/08/2014 GENERAL: Well-developed, well-nourished female in no acute distress. Morbid Obesity HEENT: Normocephalic, atraumatic.  NECK: Supple. Normal thyroid.  LUNGS: Normal rate. Clear to auscultation bilaterally.  HEART: Regular rate and rhythm with no adventitious sounds.  BREASTS: Symmetric in size. No masses, skin changes, nipple drainage, or lymphadenopathy. ABDOMEN: Soft, nontender, nondistended. No organomegaly. Normal bowel sounds appreciated in all quadrants.  PELVIC: External labia there is a right sided 10 cm soft mass .  Vagina is pink and rugated.  Normal discharge. Normal cervix contour. Pap smear obtained. Uterus is normal in size. No adnexal mass or tenderness.   EXTREMITIES: No cyanosis, clubbing, or edema, 2+ distal pulses.  Dr Marice Potterove reexamined this mass  Labs and Imaging No results  found.    Assessment & Plan:  Assessment:  Well Woman Exam  Morbid Obesity HTN Right labial mass Irregular vaginal bleeding  Plans:   She will be scheduled with Dr Marice Potterove for I&D of cyst Refill HCTZ 25 mg for one year Repeat pap Advised to stop smoking and lose weight   Rose PhenixLinda M Luretta Everly, NP 04/29/2014 9:09 AM

## 2014-05-02 LAB — CYTOLOGY - PAP

## 2014-06-01 ENCOUNTER — Ambulatory Visit (INDEPENDENT_AMBULATORY_CARE_PROVIDER_SITE_OTHER): Payer: Medicaid Other | Admitting: Obstetrics & Gynecology

## 2014-06-01 ENCOUNTER — Encounter: Payer: Self-pay | Admitting: Obstetrics & Gynecology

## 2014-06-01 VITALS — BP 126/81 | HR 78 | Temp 98.6°F | Resp 20 | Ht 64.0 in | Wt 265.1 lb

## 2014-06-01 DIAGNOSIS — N764 Abscess of vulva: Secondary | ICD-10-CM

## 2014-06-01 NOTE — Progress Notes (Signed)
   Subjective:    Patient ID: Rose Gutierrez, female    DOB: 1980/09/04, 34 y.o.   MRN: [id number]  HPI  This pleasant morbidly obese AA lady is here with a 10 year h/o a right labial abscess. It has grown to a size that bothers her. She would like it drained.  Review of Systems     Objective:   Physical Exam  Her right labia has a 5 cm cystic structure in it. I prepped the area with betadine and infiltrated it with 2 cc of lidocaine. I made a small incision and copious amounts of purulent material with particulate came out. I placed a Word catheter.      Assessment & Plan:   Right labial abscess- now drained RTC 2 weeks for Word catheter removal

## 2014-06-27 ENCOUNTER — Encounter: Payer: Self-pay | Admitting: *Deleted

## 2014-07-01 ENCOUNTER — Emergency Department (HOSPITAL_COMMUNITY)
Admission: EM | Admit: 2014-07-01 | Discharge: 2014-07-01 | Disposition: A | Discharge status: Home / Self Care | Payer: Medicaid Other | Source: Home / Self Care | Attending: Family Medicine | Admitting: Family Medicine

## 2014-07-01 ENCOUNTER — Encounter (HOSPITAL_COMMUNITY): Payer: Self-pay

## 2014-07-01 DIAGNOSIS — Z720 Tobacco use: Secondary | ICD-10-CM

## 2014-07-01 DIAGNOSIS — L309 Dermatitis, unspecified: Secondary | ICD-10-CM

## 2014-07-01 DIAGNOSIS — J40 Bronchitis, not specified as acute or chronic: Principal | ICD-10-CM

## 2014-07-01 DIAGNOSIS — J410 Simple chronic bronchitis: Secondary | ICD-10-CM

## 2014-07-01 MED ORDER — GUAIFENESIN-CODEINE 100-10 MG/5ML PO SYRP: 10.0000 mL | ORAL_SOLUTION | Freq: Four times a day (QID) | ORAL | Status: DC | PRN

## 2014-07-01 MED ORDER — FLUTICASONE PROPIONATE 0.05 % EX CREA: TOPICAL_CREAM | Freq: Two times a day (BID) | CUTANEOUS | Status: DC

## 2014-07-01 MED ORDER — DOXYCYCLINE HYCLATE 100 MG PO CAPS: 100.0000 mg | ORAL_CAPSULE | Freq: Two times a day (BID) | ORAL | Status: DC

## 2014-07-01 NOTE — ED Notes (Signed)
C/o cough, rash since Sunday. States she works w [employer], and they have asked her to be checked to be dure she is not contagious

## 2014-07-01 NOTE — Discharge Instructions (Signed)
Take all of medicine, drink lots of fluids, no more smoking, see your doctor if further problems  °

## 2014-07-04 NOTE — ED Provider Notes (Signed)
CSN: [id number]     Arrival date & time 07/01/14  0818 History   First MD Initiated Contact with Patient 07/01/14 0901     Chief Complaint  Patient presents with  . Cough   (Consider location/radiation/quality/duration/timing/severity/associated sxs/prior Treatment) HPI  Past Medical History  Diagnosis Date  . Hypertension   . Asthma   . Anxiety   . Sleep apnea    Past Surgical History  Procedure Laterality Date  . Cesearean section    . Cesarean section     Family History  Problem Relation Age of Onset  . Cancer Mother     breast   History  Substance Use Topics  . Smoking status: Current Every Day Smoker -- 0.25 packs/day for 15 years    Types: Cigarettes  . Smokeless tobacco: Never Used  . Alcohol Use: Yes     Comment: occasionally    OB History    Gravida Para Term Preterm AB TAB SAB Ectopic Multiple Living   4 3 3  0 1 0 1 0 0 3     Review of Systems  Allergies  Review of patient's allergies indicates no known allergies.  Home Medications   Prior to Admission medications   Medication Sig Start Date End Date Taking? Authorizing Provider  sertraline (ZOLOFT) 100 MG tablet Take 100 mg by mouth daily. 02/21/14  Yes Historical Provider, MD  doxycycline (VIBRAMYCIN) 100 MG capsule Take 1 capsule (100 mg total) by mouth 2 (two) times daily. 07/01/14   Linna HoffJames D Seabron Iannello, MD  fluticasone (CUTIVATE) 0.05 % cream Apply topically 2 (two) times daily. 07/01/14   Linna HoffJames D Ekta Dancer, MD  guaiFENesin-codeine [facility](ROBITUSSIN AC) 100-10 MG/5ML syrup Take 10 mLs by mouth 4 (four) times daily as needed for cough. 07/01/14   Linna HoffJames D Amethyst Gainer, MD  hydrochlorothiazide (HYDRODIURIL) 25 MG tablet Take 1 tablet (25 mg total) by mouth daily. Patient taking differently: Take 25 mg by mouth daily.  04/29/14   Delbert PhenixLinda M Barefoot, NP   BP 129/74 mmHg  Pulse 77  Temp(Src) 98.8 F (37.1 C) (Oral)  Resp 18  SpO2 98%  LMP 05/09/2014 Physical Exam  ED Course  Procedures (including critical care time) Labs  Review Labs Reviewed - No data to display  Imaging Review No results found.   MDM   1. Bronchitis due to tobacco use   2. Eczema of left hand        Linna HoffJames D Jentzen Minasyan, MD 07/04/14 2108

## 2014-07-19 ENCOUNTER — Encounter (HOSPITAL_COMMUNITY): Payer: Self-pay | Admitting: *Deleted

## 2014-07-19 ENCOUNTER — Emergency Department (HOSPITAL_COMMUNITY)
Admission: EM | Admit: 2014-07-19 | Discharge: 2014-07-19 | Disposition: A | Discharge status: Home / Self Care | Payer: Medicaid Other | Attending: Emergency Medicine | Admitting: Emergency Medicine

## 2014-07-19 DIAGNOSIS — Z720 Tobacco use: Secondary | ICD-10-CM | POA: Insufficient documentation

## 2014-07-19 DIAGNOSIS — Z79899 Other long term (current) drug therapy: Secondary | ICD-10-CM | POA: Diagnosis not present

## 2014-07-19 DIAGNOSIS — F419 Anxiety disorder, unspecified: Secondary | ICD-10-CM | POA: Diagnosis not present

## 2014-07-19 DIAGNOSIS — Z8669 Personal history of other diseases of the nervous system and sense organs: Secondary | ICD-10-CM | POA: Diagnosis not present

## 2014-07-19 DIAGNOSIS — I10 Essential (primary) hypertension: Secondary | ICD-10-CM | POA: Insufficient documentation

## 2014-07-19 DIAGNOSIS — J45909 Unspecified asthma, uncomplicated: Secondary | ICD-10-CM | POA: Insufficient documentation

## 2014-07-19 DIAGNOSIS — Z8659 Personal history of other mental and behavioral disorders: Secondary | ICD-10-CM

## 2014-07-19 DIAGNOSIS — Z7951 Long term (current) use of inhaled steroids: Secondary | ICD-10-CM | POA: Diagnosis not present

## 2014-07-19 DIAGNOSIS — Z792 Long term (current) use of antibiotics: Secondary | ICD-10-CM | POA: Insufficient documentation

## 2014-07-19 MED ORDER — LORAZEPAM 0.5 MG PO TABS: 0.5000 mg | ORAL_TABLET | Freq: Once | ORAL | Status: DC

## 2014-07-19 MED ORDER — LORAZEPAM 1 MG PO TABS: 0.5000 mg | ORAL_TABLET | Freq: Three times a day (TID) | ORAL | Status: DC | PRN

## 2014-07-19 NOTE — ED Provider Notes (Signed)
CSN: [id number]     Arrival date & time 07/19/14  1745 History   First MD Initiated Contact with Patient 07/19/14 1822     Chief Complaint  Patient presents with  . Panic Attack    off her medications x 1 month     (Consider location/radiation/quality/duration/timing/severity/associated sxs/prior Treatment) HPI    PCP: Default, Provider, MD Blood pressure 136/87, pulse 84, temperature 98.3 F (36.8 C), temperature source Oral, resp. rate 26, SpO2 100 %.  Rose Gutierrez is a 34 y.o.female with a significant PMH of  Hypertension, asthma, anxiety, sleep apnea presents to the ER with complaints of panic attack. She reports having history of bipolar disorder, schizophrenia and panic attacks. [facility] and has a follow-up appointment tomorrow.  Today she has another episode of many reporting feeling "empty inside and numb all over". When that happens she splashes cold water in her face and bites her tongue to bring herself back. Denies hallucinations, delusions, paranoia, HI/SI, confusion. Her sister called EMS because she saw the signs of her developing a panic attack her patient. The patient is feeling better on arrival but reports still feeling mildly hallow. She see's [facility] tomorrow for a medication adjustment.    Past Medical History  Diagnosis Date  . Hypertension   . Asthma   . Anxiety   . Sleep apnea    Past Surgical History  Procedure Laterality Date  . Cesearean section    . Cesarean section     Family History  Problem Relation Age of Onset  . Cancer Mother     breast   History  Substance Use Topics  . Smoking status: Current Every Day Smoker -- 0.25 packs/day for 15 years    Types: Cigarettes  . Smokeless tobacco: Never Used  . Alcohol Use: Yes     Comment: occasionally    OB History    Gravida Para Term Preterm AB TAB SAB Ectopic Multiple Living   4 3 3  0 1 0 1 0 0 3     Review of Systems  10 Systems reviewed and are negative for  acute change except as noted in the HPI.   Allergies  Review of patient's allergies indicates no known allergies.  Home Medications   Prior to Admission medications   Medication Sig Start Date End Date Taking? Authorizing Provider  fluticasone (CUTIVATE) 0.05 % cream Apply topically 2 (two) times daily. 07/01/14  Yes Linna HoffJames D Kindl, MD  guaiFENesin-codeine [facility]ROBITUSSIN AC) 100-10 MG/5ML syrup Take 10 mLs by mouth 4 (four) times daily as needed for cough. 07/01/14  Yes Linna HoffJames D Kindl, MD  doxycycline (VIBRAMYCIN) 100 MG capsule Take 1 capsule (100 mg total) by mouth 2 (two) times daily. 07/01/14   Linna HoffJames D Kindl, MD  hydrochlorothiazide (HYDRODIURIL) 25 MG tablet Take 1 tablet (25 mg total) by mouth daily. Patient not taking: Reported on 07/19/2014 04/29/14   Delbert PhenixLinda M Barefoot, NP  LORazepam (ATIVAN) 1 MG tablet Take 0.5-1 tablets (0.5-1 mg total) by mouth 3 (three) times daily as needed for anxiety. 07/19/14   Zandyr Barnhill Neva SeatGreene, PA-C   BP 136/87 mmHg  Pulse 84  Temp(Src) 98.3 F (36.8 C) (Oral)  Resp 26  SpO2 100% Physical Exam  Constitutional: She appears well-developed and well-nourished. No distress.  HENT:  Head: Normocephalic and atraumatic.  Eyes: Pupils are equal, round, and reactive to light.  Neck: Normal range of motion. Neck supple.  Cardiovascular: Normal rate and regular rhythm.   Pulmonary/Chest: Effort  normal.  Abdominal: Soft.  Neurological: She is alert.  Skin: Skin is warm and dry.  Psychiatric: Her speech is normal and behavior is normal. Her mood appears anxious. She does not exhibit a depressed mood. She expresses no homicidal and no suicidal ideation. She expresses no suicidal plans and no homicidal plans.  Nursing note and vitals reviewed.   ED Course  Procedures (including critical care time) Labs Review Labs Reviewed - No data to display  Imaging Review No results found.   EKG Interpretation None      MDM   Final diagnoses:  Anxiety  History of  schizophrenia    Discussed the patient regarding possible SI/HI, she denies. No longer having a panic attack. Pt requesting to go home.  She appears to be coherent and not acutely psychotic, she denies any other medical complaints at this time.  Discussed follow-up with [facility] and a small rx for Ativan for emergency medication.  Patient discharged but left without paper work, dose of Ativan or prescription.    33 y.o.Simeon Craft evaluation in the Emergency Department is complete. It has been determined that no acute conditions requiring further emergency intervention are present at this time. The patient/guardian have been advised of the diagnosis and plan. We have discussed signs and symptoms that warrant return to the ED, such as changes or worsening in symptoms.  Vital signs are stable at discharge. Filed Vitals:   07/19/14 1751  BP: 136/87  Pulse: 84  Temp: 98.3 F (36.8 C)  Resp: 26    Patient/guardian has voiced understanding and agreed to follow-up with the PCP or specialist.    Marlon Pel, PA-C 07/19/14 1920  Eber Hong, MD 07/19/14 2350

## 2014-07-19 NOTE — Discharge Instructions (Signed)
Panic Attacks Panic attacks are sudden, short-livedsurges of severe anxiety, fear, or discomfort. They may occur for no reason when you are relaxed, when you are anxious, or when you are sleeping. Panic attacks may occur for a number of reasons:   Healthy people occasionally have panic attacks in extreme, life-threatening situations, such as war or natural disasters. Normal anxiety is a protective mechanism of the body that helps [place] react to danger (fight or flight response).  Panic attacks are often seen with anxiety disorders, such as panic disorder, social anxiety disorder, generalized anxiety disorder, and phobias. Anxiety disorders cause excessive or uncontrollable anxiety. They may interfere with your relationships or other life activities.  Panic attacks are sometimes seen with other mental illnesses, such as depression and posttraumatic stress disorder.  Certain medical conditions, prescription medicines, and drugs of abuse can cause panic attacks. SYMPTOMS  Panic attacks start suddenly, peak within 20 minutes, and are accompanied by four or more of the following symptoms:  Pounding heart or fast heart rate (palpitations).  Sweating.  Trembling or shaking.  Shortness of breath or feeling smothered.  Feeling choked.  Chest pain or discomfort.  Nausea or strange feeling in your stomach.  Dizziness, light-headedness, or feeling like you will faint.  Chills or hot flushes.  Numbness or tingling in your lips or hands and feet.  Feeling that things are not real or feeling that you are not yourself.  Fear of losing control or going crazy.  Fear of dying. Some of these symptoms can mimic serious medical conditions. For example, you may think you are having a heart attack. Although panic attacks can be very scary, they are not life threatening. DIAGNOSIS  Panic attacks are diagnosed through an assessment by your health care provider. Your health care provider will ask  questions about your symptoms, such as where and when they occurred. Your health care provider will also ask about your medical history and use of alcohol and drugs, including prescription medicines. Your health care provider may order blood tests or other studies to rule out a serious medical condition. Your health care provider may refer you to a mental health professional for further evaluation. TREATMENT   Most healthy people who have one or two panic attacks in an extreme, life-threatening situation will not require treatment.  The treatment for panic attacks associated with anxiety disorders or other mental illness typically involves counseling with a mental health professional, medicine, or a combination of both. Your health care provider will help determine what treatment is best for you.  Panic attacks due to physical illness usually go away with treatment of the illness. If prescription medicine is causing panic attacks, talk with your health care provider about stopping the medicine, decreasing the dose, or substituting another medicine.  Panic attacks due to alcohol or drug abuse go away with abstinence. Some adults need professional help in order to stop drinking or using drugs. HOME CARE INSTRUCTIONS   Take all medicines as directed by your health care provider.   Schedule and attend follow-up visits as directed by your health care provider. It is important to keep all your appointments. SEEK MEDICAL CARE IF:  You are not able to take your medicines as prescribed.  Your symptoms do not improve or get worse. SEEK IMMEDIATE MEDICAL CARE IF:   You experience panic attack symptoms that are different than your usual symptoms.  You have serious thoughts about hurting yourself or others.  You are taking medicine for panic attacks and  have a serious side effect. MAKE SURE YOU:  Understand these instructions.  Will watch your condition.  Will get help right away if you are not  doing well or get worse. Document Released: 04/08/2005 Document Revised: 04/13/2013 Document Reviewed: 11/20/2012 [facility] Patient Information 2015 [place], [place]. This information is not intended to replace advice given to you by your health care provider. Make sure you discuss any questions you have with your health care provider.   RESOURCE GUIDE  Chronic Pain Problems: Contact [facility]  [phone] Patients need to be referred by their primary care doctor.  Insufficient Money for Medicine: Contact United Way:  call "211" or [place] [phone].  No Primary Care Doctor: Call Health Connect  [phone] - can help you locate a primary care doctor that  accepts your insurance, provides certain services, etc. Physician Referral Service- [phone]  Agencies that provide inexpensive medical care: [facility]  [facility]  [phone] [facility]  [phone] [facility]  [phone] Planned [facility]  [phone] [facility]  [phone]  [place] Providers: [facility]- [address], [address]  [phone], Mon-Fri 9am-7pm, Sat 9am-1pm [place], [address], [address] [place]  [place]- [address]  [place], [address], [phone]  Only accepts [place] patients after they have their name  applied to their card  Self Pay (no insurance) in [facility]: Sickle Cell Patients: Dr Kevan Ny, [facility]  [place], [facility]- [place]  [facility] [place]- [address], [facility]- see information above (Speak to [employer] if you do  not have insurance)       -  [facility]- [place], [place],  [place]ie [place], [place]  Dr Vista Lawman-  [address], [address], [place], [facility]- [address], [phone]       -  [facility] [place]- [address], [place], also [address], [phone]       -    [facility]- [address], [place], 1st & 3rd Saturday   every month, 10am-1pm  1) Find a Doctor and Pay Out of Pocket Although you won't have to find out who is covered by your insurance plan, it is a good idea to ask around and get recommendations. You will then need to call the office and see if the doctor you have chosen will accept you as a new patient and what types of options they offer for patients who are self-pay. Some doctors offer discounts or will set up payment plans for their patients who do not have insurance, but you will need to ask so you aren't surprised when you get to your appointment.  2) Contact Your Local Health Department  Not all health departments have doctors that can see patients for sick visits, but many do, so it is worth a call to see if yours does. If you don't know where your local health department is, you can check in your phone book. The CDC also has a tool to help you locate your state's health department, and many state websites also have listings of all of their local health departments.  3) Find a [place] [facility] If your illness is not likely to be very severe or complicated, you may want to try a walk in clinic. These are popping up all over the country in pharmacies, drugstores, and shopping centers. They're usually staffed by nurse practitioners or physician assistants that have been trained to treat common illnesses and complaints. They're usually fairly quick and inexpensive. However, if you have  serious medical issues or chronic medical problems, these are probably not your best option  [facility] [place], [facility], [address] [place], phone [phone] or [phone].  Monday - Friday, call for an appointment. [place], [facility], [address], [place], phone [phone] or [phone].  Monday - Friday, call for an appointment.  Abuse/Neglect: [place] [phone] [place] [phone] (After Hours)  Emergency Shelter:  [facility] [phone]  Maternity Homes: [facility] [phone] [place] [phone]  MRSA Hotline #:   623-060-3230  [facility] [place] Dept. [address]         [place] Phone:  [phone]                                  Phone:  [phone]                   Phone:  [phone]  [facility], [place]- [phone]       -     [facility] in [place], [address]                                  [place] [phone] or [phone] (After Hours)   [place]  Substance Abuse Resources: Alcohol and Drug Services  [phone] [place] [phone] [facility] [phone] Residential & Outpatient Substance Abuse Program  [phone]  Psychological Services: [place]  [phone] [place]  [place], [place]. [address] [place], [place]: [phone] or [phone], [email or web address]  Dental Assistance  If unable to pay or uninsured, contact:  [place] or [facility]. to become qualified for the adult dental clinic.  Patients with Medicaid: [facility] [phone] [address], [place] [address] [phone]  If unable to pay, or uninsured, contact [facility] [phone]) or [facility] [phone] in [place], [place] in [facility]) to become qualified for the adult dental clinic  Other [place]- [place], [place], [place], [address], [place], [place], 2nd and 4th Thursday of the month at 6:30am.  10 clients each day by appointment, can sometimes see walk-in patients if someone does not show for an appointment. [facility]- [address] [place] [place], [place], [address], [place], [place], [place], [address], [place] [facility] [phone]  Please make every effort to establish with a primary care physician for routine medical care  [place]  The [place] provides a wide range of adult health services. Some of these services are designed to address the healthcare needs of all [facility] residents and all services are designed to meet the needs of uninsured/underinsured low income residents. Some services are available to any resident of [place], call [phone] for details. ] The [facility], a new medical clinic for adults, is now open. For more information about the Center and its services please call [phone]. For information on our [place] services, click here.  For more  information on any of the following Department of Public Health programs, including hours of service, click on the highlighted link.  SERVICES FOR WOMEN (Adults and Teens) [employer] provide a full range of birth control options plus education and counseling. New patient visit and annual return visits include a complete examination, pap test as indicated, and other laboratory as indicated. Included is our [employer] for men.  Maternity Care is provided through pregnancy, including a six week post partum exam. Women who meet eligibility criteria for the Medicaid for Pregnant Women program, receive care free. Other women are charged on a sliding scale according to income. Note: [facility] provides services to pregnant women who have a Medicaid card. Call [phone] for an appointment in [place] or [phone] for an appointment in [facility].  Primary Care for Medicaid [place] Access Women is available through the [place]. As primary care provider for the [place] program, women may designate the [facility] clinic as their primary care provider.  PLEASE CALL R5958090 FOR AN APPOINTMENT FOR THE ABOVE SERVICES IN EITHER [place] OR [place]. Information available in [place] and [place].   Childbirth Education Classes are open to the public and offered to help families prepare for the best possible childbirth experience as well as to promote lifelong health and wellness. Classes are offered throughout the year and meet on the same night once a week for five weeks. Medicaid covers the cost of the classes for the mother-to-be and her partner. For participants without Medicaid, the cost of the class series is $45.00 for the mother-to-be and her partner. Class size is limited and registration is required. For more information or to register call [phone]. Baby items donated by  [employer] and the [employer] of [place] are given away during  each class series.  SERVICES FOR WOMEN AND MEN Sexually Transmitted Infection appointments, including HIV testing, are available daily (weekdays, except holidays). Call early as same-day appointments are limited. For an appointment in either [facility] or [place], call [phone]. Services are confidential and free of charge.  Skin Testing for Tuberculosis Please call [phone]. Adult Immunizations are available, usually for a fee. Please call [phone] for details.  PLEASE CALL [phone] FOR AN APPOINTMENT FOR THE ABOVE SERVICES IN EITHER [place] OR [place].   [facility] provides up to the minute recommended vaccines for your travel destination. We also provide essential health and political information to help insure a safe and pleasurable travel experience. This program is self-sustaining, however, fees are very competitive. We are a CERTIFIED YELLOW FEVER IMMUNIZATION approved clinic site. PLEASE CALL [phone] FOR AN APPOINTMENT IN EITHER [place] OR [place].   If you have questions about the services listed above, we want to answer them! Email [place] at: [email or web address] Home Visiting Services for elderly and the disabled are available to residents of [facility] who are in need of care that compares to the care offered by a nursing home, have needs that can be met by the program, and have [place] Medicaid. Other short term services are available to residents 18 years and older who are unable to meet requirements for eligibility to receive services from a certified home health agency, spend the majority of time at home, and need care for six months or less.  PLEASE CALL H548482 OR [phone] FOR MORE INFORMATION. Medication Assistance Program serves as a link between pharmaceutical companies and patients to provide low cost or free prescription medications. This servce is available for  residents who meet certain income restrictions and have no insurance coverage.  PLEASE CALL [phone] ([place]) OR [phone] ([place]) FOR MORE INFORMATION.  Updated Feb. 21, 2013

## 2014-07-19 NOTE — ED Notes (Signed)
Per EMS #31. Pt called due to "panic attack". Stated that she has been off her medications x 1 month". Pt is alert, oriented, and appropriate,. Initial BP 220/130

## 2014-07-19 NOTE — ED Notes (Signed)
Pt reports she has been off zoloft for a month, [facility] and they are working on getting her medications fixed. [facility] for a job interview. Pt [facility]. Denies SI/HI, AH/VH. Denies pain. Reports she was sitting at her table and started having a panic attack, "felt invisible". Pt called ems. Pt reports she started calming down once she got here.

## 2014-08-26 ENCOUNTER — Encounter (HOSPITAL_COMMUNITY): Payer: Self-pay

## 2014-08-26 ENCOUNTER — Emergency Department (HOSPITAL_COMMUNITY)
Admission: EM | Admit: 2014-08-26 | Discharge: 2014-08-27 | Disposition: A | Discharge status: Home / Self Care | Payer: Medicaid Other | Attending: Emergency Medicine | Admitting: Emergency Medicine

## 2014-08-26 DIAGNOSIS — F419 Anxiety disorder, unspecified: Secondary | ICD-10-CM | POA: Diagnosis not present

## 2014-08-26 DIAGNOSIS — Z3202 Encounter for pregnancy test, result negative: Secondary | ICD-10-CM | POA: Insufficient documentation

## 2014-08-26 DIAGNOSIS — I10 Essential (primary) hypertension: Secondary | ICD-10-CM | POA: Insufficient documentation

## 2014-08-26 DIAGNOSIS — Z7952 Long term (current) use of systemic steroids: Secondary | ICD-10-CM | POA: Insufficient documentation

## 2014-08-26 DIAGNOSIS — J45909 Unspecified asthma, uncomplicated: Secondary | ICD-10-CM | POA: Insufficient documentation

## 2014-08-26 DIAGNOSIS — F32A Depression, unspecified: Secondary | ICD-10-CM

## 2014-08-26 DIAGNOSIS — Z79899 Other long term (current) drug therapy: Secondary | ICD-10-CM | POA: Insufficient documentation

## 2014-08-26 DIAGNOSIS — G479 Sleep disorder, unspecified: Secondary | ICD-10-CM | POA: Diagnosis not present

## 2014-08-26 DIAGNOSIS — F329 Major depressive disorder, single episode, unspecified: Secondary | ICD-10-CM | POA: Diagnosis not present

## 2014-08-26 DIAGNOSIS — F250 Schizoaffective disorder, bipolar type: Secondary | ICD-10-CM | POA: Diagnosis not present

## 2014-08-26 DIAGNOSIS — R45851 Suicidal ideations: Secondary | ICD-10-CM

## 2014-08-26 DIAGNOSIS — Z720 Tobacco use: Secondary | ICD-10-CM | POA: Insufficient documentation

## 2014-08-26 HISTORY — DX: Major depressive disorder, single episode, unspecified: F32.9

## 2014-08-26 HISTORY — DX: Depression, unspecified: F32.A

## 2014-08-26 HISTORY — DX: Suicidal ideations: R45.851

## 2014-08-26 LAB — CBC
HCT: 37.1 % (ref 36.0–46.0)
Hemoglobin: 11.6 g/dL — ABNORMAL LOW (ref 12.0–15.0)
MCH: 28.2 pg (ref 26.0–34.0)
MCHC: 31.3 g/dL (ref 30.0–36.0)
MCV: 90.3 fL (ref 78.0–100.0)
PLATELETS: 360 10*3/uL (ref 150–400)
RBC: 4.11 MIL/uL (ref 3.87–5.11)
RDW: 14 % (ref 11.5–15.5)
WBC: 7.4 10*3/uL (ref 4.0–10.5)

## 2014-08-26 LAB — COMPREHENSIVE METABOLIC PANEL
ALT: 19 U/L (ref 14–54)
AST: 16 U/L (ref 15–41)
Albumin: 4 g/dL (ref 3.5–5.0)
Alkaline Phosphatase: 60 U/L (ref 38–126)
Anion gap: 9 (ref 5–15)
BUN: 13 mg/dL (ref 6–20)
CHLORIDE: 107 mmol/L (ref 101–111)
CO2: 26 mmol/L (ref 22–32)
Calcium: 9.1 mg/dL (ref 8.9–10.3)
Creatinine, Ser: 0.6 mg/dL (ref 0.44–1.00)
GFR calc non Af Amer: >60 mL/min (ref >60)
Glucose, Bld: 96 mg/dL (ref 70–99)
POTASSIUM: 3.5 mmol/L (ref 3.5–5.1)
Sodium: 142 mmol/L (ref 135–145)
TOTAL PROTEIN: 7.8 g/dL (ref 6.5–8.1)
Total Bilirubin: 0.3 mg/dL (ref 0.3–1.2)

## 2014-08-26 LAB — RAPID URINE DRUG SCREEN, HOSP PERFORMED
Amphetamines: NOT DETECTED
BARBITURATES: NOT DETECTED
Benzodiazepines: NOT DETECTED
Cocaine: NOT DETECTED
OPIATES: NOT DETECTED
Tetrahydrocannabinol: NOT DETECTED

## 2014-08-26 LAB — ACETAMINOPHEN LEVEL

## 2014-08-26 LAB — PREGNANCY, URINE: Preg Test, Ur: NEGATIVE

## 2014-08-26 LAB — ETHANOL: Alcohol, Ethyl (B): <5 mg/dL (ref <5)

## 2014-08-26 LAB — SALICYLATE LEVEL: Salicylate Lvl: <4 mg/dL (ref 2.8–30.0)

## 2014-08-26 MED ORDER — NICOTINE 21 MG/24HR TD PT24: 21.0000 mg | MEDICATED_PATCH | Freq: Every day | TRANSDERMAL | Status: DC

## 2014-08-26 MED ORDER — ACETAMINOPHEN 325 MG PO TABS: 650.0000 mg | ORAL_TABLET | ORAL | Status: DC | PRN

## 2014-08-26 MED ORDER — IBUPROFEN 200 MG PO TABS: 600.0000 mg | ORAL_TABLET | Freq: Three times a day (TID) | ORAL | Status: DC | PRN

## 2014-08-26 MED ORDER — ONDANSETRON HCL 4 MG PO TABS: 4.0000 mg | ORAL_TABLET | Freq: Three times a day (TID) | ORAL | Status: DC | PRN

## 2014-08-26 MED ORDER — ZOLPIDEM TARTRATE 5 MG PO TABS: 5.0000 mg | ORAL_TABLET | Freq: Every evening | ORAL | Status: DC | PRN

## 2014-08-26 NOTE — ED Notes (Signed)
Pt states suicidal thoughts. Pt called mobile crisis.  Mobile crisis had pt IVC and had [employer] pick her up.  Pt told crisis that she wanted to kill self.  Plan was to stab self or set self on fire. Pt broke up with boyfriend today.  States she now "lives alone with her three kids".  Pt has hx of depression and came off meds in Janurary - ish.  States they were going to change them but she never went back on any new meds.

## 2014-08-26 NOTE — ED Notes (Signed)
Patient and belongings were wanded

## 2014-08-26 NOTE — ED Notes (Signed)
Patient has been crying off and on since she came back to the Psych ED. Patient let out a big moan one time when crying and just says "I just want to go home". Patient guarded and feels scared here due to not knowing anyone per patient. Concerned about children. Did not want any medication for sleep or anxiety at this time. Wants door to be open

## 2014-08-26 NOTE — ED Provider Notes (Signed)
CSN: [id number]     Arrival date & time 08/26/14  1845 History   First MD Initiated Contact with Patient 08/26/14 1944    This chart was scribed for Margarita Grizzleanielle Brinden Kincheloe, MD by Marica OtterNusrat Rahman, ED Scribe. This patient was seen in room [facility]1/[facility] and the patient's care was started at 7:44 PM.  Chief Complaint  Patient presents with  . Suicidal   The history is provided by the patient. No language interpreter was used.   PCP: Default, Provider, MD HPI Comments: Rose Gutierrez is a 34 y.o. female, brought in by police, with PMH noted below including bipolar disorder, EtOH use (one shot of liquor today), HTN, and schizophrenia, complaining of SI onset today. Pt denies having a specific plan. Pt denies any recent meds for her depression; noting that she was previously on Zoloft and discontinued use in February when she ran out of the med.   Past Medical History  Diagnosis Date  . Hypertension   . Asthma   . Anxiety   . Sleep apnea   . Depression   . Suicidal ideation    Past Surgical History  Procedure Laterality Date  . Cesearean section    . Cesarean section     Family History  Problem Relation Age of Onset  . Cancer Mother     breast   History  Substance Use Topics  . Smoking status: Current Every Day Smoker -- 0.25 packs/day for 15 years    Types: Cigarettes  . Smokeless tobacco: Never Used  . Alcohol Use: Yes     Comment: occasionally    OB History    Gravida Para Term Preterm AB TAB SAB Ectopic Multiple Living   4 3 3  0 1 0 1 0 0 3     Review of Systems  Constitutional: Negative for fever and chills.  Psychiatric/Behavioral: Positive for suicidal ideas and sleep disturbance. Negative for confusion.  All other systems reviewed and are negative.  Allergies  Review of patient's allergies indicates no known allergies.  Home Medications   Prior to Admission medications   Medication Sig Start Date End Date Taking? Authorizing Provider  doxycycline (VIBRAMYCIN) 100 MG capsule  Take 1 capsule (100 mg total) by mouth 2 (two) times daily. 07/01/14   Linna HoffJames D Kindl, MD  fluticasone (CUTIVATE) 0.05 % cream Apply topically 2 (two) times daily. 07/01/14   Linna HoffJames D Kindl, MD  guaiFENesin-codeine [facility](ROBITUSSIN AC) 100-10 MG/5ML syrup Take 10 mLs by mouth 4 (four) times daily as needed for cough. 07/01/14   Linna HoffJames D Kindl, MD  hydrochlorothiazide (HYDRODIURIL) 25 MG tablet Take 1 tablet (25 mg total) by mouth daily. Patient not taking: Reported on 07/19/2014 04/29/14   Delbert PhenixLinda M Barefoot, NP  LORazepam (ATIVAN) 1 MG tablet Take 0.5-1 tablets (0.5-1 mg total) by mouth 3 (three) times daily as needed for anxiety. 07/19/14   Marlon Peliffany Greene, PA-C   Triage Vitals: BP 142/96 mmHg  Pulse 75  Temp(Src) 98.2 F (36.8 C) (Oral)  Resp 20  SpO2 99%  LMP  (LMP Unknown) Physical Exam  Constitutional: She is oriented to person, place, and time. She appears well-developed and well-nourished. No distress.  HENT:  Head: Normocephalic and atraumatic.  Right Ear: External ear normal.  Left Ear: External ear normal.  Nose: Nose normal.  Eyes: Conjunctivae and EOM are normal. Pupils are equal, round, and reactive to light.  Neck: Normal range of motion. Neck supple.  Pulmonary/Chest: Effort normal.  Musculoskeletal: Normal range of motion.  Neurological: She  is alert and oriented to person, place, and time. She exhibits normal muscle tone. Coordination normal.  Skin: Skin is warm and dry.  Psychiatric: She has a normal mood and affect. Her behavior is normal. Thought content normal.  Nursing note and vitals reviewed.   ED Course  Procedures (including critical care time) DIAGNOSTIC STUDIES: Oxygen Saturation is 99% on RA, nl by my interpretation.    COORDINATION OF CARE: 7:52 PM-Discussed treatment plan which includes clearance for telepsych with pt at bedside and pt agreed to plan.   Labs Review Labs Reviewed  ACETAMINOPHEN LEVEL - Abnormal; Notable for the following:    Acetaminophen  (Tylenol), Serum <10 (*)    All other components within normal limits  CBC - Abnormal; Notable for the following:    Hemoglobin 11.6 (*)    All other components within normal limits  COMPREHENSIVE METABOLIC PANEL  ETHANOL  SALICYLATE LEVEL  URINE RAPID DRUG SCREEN (HOSP PERFORMED)  PREGNANCY, URINE    Imaging Review No results found.   EKG Interpretation None      MDM   Final diagnoses:  Depression  Suicidal ideation   I personally performed the services described in this documentation, which was scribed in my presence. The recorded information has been reviewed and considered.     Margarita Grizzleanielle Yaresly Menzel, MD 08/26/14 2329

## 2014-08-26 NOTE — BH Assessment (Signed)
Assessment completed. Spoke with Dr. Rosalia Hammersay who reported that pt was sobbing and reporting that her children are better off without her. Consulted Hulan FessIjeoma Nwaeze, NP who recommended that pt be evaluated by psychiatry in the am.
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
## 2014-08-26 NOTE — ED Notes (Signed)
Patient tearful on approach when coming back to psych hold. Patient reporting that she doesn't want to be in the hospital anymore. Childlike at this time and guarded. Speaking very softly while crying. Answering questions minimally. Denies current suicidal ideations but looking down when speaking to her. Appears depressed. Explained some of the rules back here. Refused nicotine patch at this time. Cooperative at this time

## 2014-08-27 DIAGNOSIS — F250 Schizoaffective disorder, bipolar type: Secondary | ICD-10-CM | POA: Diagnosis present

## 2014-08-27 DIAGNOSIS — R45851 Suicidal ideations: Secondary | ICD-10-CM | POA: Insufficient documentation

## 2014-08-27 MED ORDER — ARIPIPRAZOLE 5 MG PO TABS
5.0000 mg | ORAL_TABLET | Freq: Every day | ORAL | Status: DC
  Administered 2014-08-27: 5 mg via ORAL
  Filled 2014-08-27: qty 1

## 2014-08-27 MED ORDER — SERTRALINE HCL 50 MG PO TABS
100.0000 mg | ORAL_TABLET | Freq: Every day | ORAL | Status: DC
  Administered 2014-08-27: 100 mg via ORAL
  Filled 2014-08-27: qty 2

## 2014-08-27 MED ORDER — SERTRALINE HCL 100 MG PO TABS: 100.0000 mg | ORAL_TABLET | Freq: Every day | ORAL | Status: DC

## 2014-08-27 MED ORDER — ARIPIPRAZOLE 5 MG PO TABS: 5.0000 mg | ORAL_TABLET | Freq: Every day | ORAL | Status: DC

## 2014-08-27 NOTE — Progress Notes (Signed)
Patient denies SI/HI and psychosis. Patient contracted for safety. Patient was provided information to [facility] Monday for follow [facility]. [facility] reports that [facility] and she has been there before.  Patient reports that she needs to call her ride because she does not have transportation. LCSW instructed patient to go to the nursing station to use the phone.

## 2014-08-27 NOTE — [facility] Suicide Risk Assessment (Addendum)
Suicide Risk Assessment  Discharge Assessment   [facility]   Demographic Factors:  NA  Total Time spent with patient: 45 minutes  Musculoskeletal: Strength & Muscle Tone: within normal limits Gait & Station: normal Patient leans: N/A  Psychiatric Specialty Exam:     Blood pressure 113/69, pulse 76, temperature 98.7 F (37.1 C), temperature source Oral, resp. rate 18, SpO2 99 %.There is no weight on file to calculate BMI.  General Appearance: Casual  Eye Contact::  Good  Speech:  Normal Rate  Volume:  Normal  Mood:  Depressed  Affect:  Congruent  Thought Process:  Coherent  Orientation:  Full (Time, Place, and Person)  Thought Content:  WDL  Suicidal Thoughts:  No  Homicidal Thoughts:  No  Memory:  Immediate;   Good Recent;   Good Remote;   Good  Judgement:  Good  Insight:  Good  Psychomotor Activity:  Normal  Concentration:  Good  Recall:  Good  Fund of Knowledge:Good  Language: Good  Akathisia:  No  Handed:  Right  AIMS (if indicated):     Assets:  Health and safety inspectorinancial Resources/Insurance Housing Leisure Time Physical Health Resilience Social Support  ADL's:  Intact  Cognition: WNL  Sleep:          Has this patient used any form of tobacco in the last 30 days? (Cigarettes, Smokeless Tobacco, Cigars, and/or Pipes) Yes, patient refused smoking cessation Rx and resources  Mental Status Per Nursing Assessment::   On Admission:   Suicidal ideations, passive  Current Mental Status by Physician: NA  Loss Factors: Loss of significant relationship  Historical Factors: NA  Risk Reduction Factors:   Responsible for children under 32 years of age, Sense of responsibility to family, Employed, Living with another person, especially a relative, Positive social support, Positive therapeutic relationship and Positive coping skills or problem solving skills  Continued Clinical Symptoms:  Depression,mild  Cognitive Features That Contribute To  Risk:  None    Suicide Risk:  Minimal: No identifiable suicidal ideation.  Patients presenting with no risk factors but with morbid ruminations; may be classified as minimal risk based on the severity of the depressive symptoms  Principal Problem: Schizoaffective disorder, bipolar type Discharge Diagnoses:  Patient Active Problem List   Diagnosis Date Noted  . Schizoaffective disorder, bipolar type [F25.0] 08/27/2014    Priority: High  . Suicidal ideation [R45.851]   . Morbid obesity [E66.01] 04/29/2014  . HTN (hypertension) [I10] 04/29/2014  . 

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
## 2014-12-16 ENCOUNTER — Emergency Department (INDEPENDENT_AMBULATORY_CARE_PROVIDER_SITE_OTHER)
Admission: EM | Admit: 2014-12-16 | Discharge: 2014-12-16 | Disposition: A | Discharge status: Home / Self Care | Payer: Medicaid Other | Source: Home / Self Care | Attending: Family Medicine | Admitting: Family Medicine

## 2014-12-16 ENCOUNTER — Encounter (HOSPITAL_COMMUNITY): Payer: Self-pay | Admitting: *Deleted

## 2014-12-16 DIAGNOSIS — L859 Epidermal thickening, unspecified: Secondary | ICD-10-CM

## 2014-12-16 MED ORDER — BETAMETHASONE DIPROPIONATE 0.05 % EX OINT: TOPICAL_OINTMENT | Freq: Two times a day (BID) | CUTANEOUS | Status: DC

## 2014-12-16 NOTE — Discharge Instructions (Signed)
Your Tb test was negative and no x-ray is needed. See dermatologist if further hand rash problem

## 2014-12-16 NOTE — ED Provider Notes (Signed)
CSN: [id number]     Arrival date & time 12/16/14  1701 History   First MD Initiated Contact with Patient 12/16/14 1838     Chief Complaint  Patient presents with  . Rash   (Consider location/radiation/quality/duration/timing/severity/associated sxs/prior Treatment) Patient is a 34 y.o. female presenting with rash. The history is provided by the patient.  Rash Location:  Hand Hand rash location:  L hand Quality: itchiness   Severity:  Moderate Onset quality:  Gradual Duration:  5 months Progression:  Unchanged Chronicity:  Chronic Ineffective treatments:  Topical steroids   Past Medical History  Diagnosis Date  . Hypertension   . Asthma   . Anxiety   . Sleep apnea   . Depression   . Suicidal ideation    Past Surgical History  Procedure Laterality Date  . Cesearean section    . Cesarean section     Family History  Problem Relation Age of Onset  . Cancer Mother     breast   Social History  Substance Use Topics  . Smoking status: Current Every Day Smoker -- 0.25 packs/day for 15 years    Types: Cigarettes  . Smokeless tobacco: Never Used  . Alcohol Use: Yes     Comment: occasionally    OB History    Gravida Para Term Preterm AB TAB SAB Ectopic Multiple Living   0 1 0 1 0 0 3     Review of Systems  Constitutional: Negative.   Skin: Positive for rash.    Allergies  Review of patient's allergies indicates no known allergies.  Home Medications   Prior to Admission medications   Medication Sig Start Date End Date Taking? Authorizing Provider  ARIPiprazole (ABILIFY) 5 MG tablet Take 1 tablet (5 mg total) by mouth daily. 08/27/14   Charm Rings, NP  betamethasone dipropionate (DIPROLENE) 0.05 % ointment Apply topically 2 (two) times daily. 12/16/14   Linna Hoff, MD  fluticasone (CUTIVATE) 0.05 % cream Apply topically 2 (two) times daily. 07/01/14   Linna Hoff, MD  hydrochlorothiazide (HYDRODIURIL) 25 MG tablet Take 1 tablet (25 mg total) by mouth  daily. Patient not taking: Reported on 07/19/2014 04/29/14   Delbert Phenix, NP  sertraline (ZOLOFT) 100 MG tablet Take 1 tablet (100 mg total) by mouth daily. 08/27/14   Charm Rings, NP   Meds Ordered and Administered this Visit  Medications - No data to display  BP 156/99 mmHg  Pulse 78  Temp(Src) 98.5 F (36.9 C) (Oral)  Resp 16  SpO2 97%  LMP 12/03/2014 No data found.   Physical Exam  Constitutional: She appears well-developed and well-nourished. No distress.  Skin: Skin is warm and dry. Rash noted. No erythema.     Nursing note and vitals reviewed.   ED Course  Procedures (including critical care time)  Labs Review Labs Reviewed - No data to display  Imaging Review No results found.   Visual Acuity Review  Right Eye Distance:   Left Eye Distance:   Bilateral Distance:    Right Eye Near:   Left Eye Near:    Bilateral Near:         MDM   1. Hyperkeratotic hand dermatitis        Linna Hoff, MD 12/18/14 2047

## 2014-12-16 NOTE — ED Notes (Signed)
Pt  Has  2  Symptoms   -     He  Reports        Symptoms      Of   A  Rash  On the  l  Hand  That  She  Has  Had  For  msome  Months  Getting  Worse       Pt  Also  Reports  Was  Told  By  A  Nurse  That   Tb test  On  Her  r  Arm   May  Be  Positive      She  denys  Any  Fever  /  Cough  And  Is  In no  Acute  Distress

## 2015-05-24 ENCOUNTER — Encounter (HOSPITAL_COMMUNITY): Payer: Self-pay | Admitting: *Deleted

## 2015-05-24 ENCOUNTER — Inpatient Hospital Stay (HOSPITAL_COMMUNITY)
Admission: AD | Admit: 2015-05-24 | Discharge: 2015-05-24 | Disposition: A | Discharge status: Home / Self Care | Payer: Medicaid Other | Source: Ambulatory Visit | Attending: Obstetrics and Gynecology | Admitting: Obstetrics and Gynecology

## 2015-05-24 DIAGNOSIS — F17210 Nicotine dependence, cigarettes, uncomplicated: Secondary | ICD-10-CM | POA: Insufficient documentation

## 2015-05-24 DIAGNOSIS — A5901 Trichomonal vulvovaginitis: Secondary | ICD-10-CM

## 2015-05-24 DIAGNOSIS — N760 Acute vaginitis: Secondary | ICD-10-CM | POA: Diagnosis not present

## 2015-05-24 DIAGNOSIS — R102 Pelvic and perineal pain: Secondary | ICD-10-CM | POA: Diagnosis present

## 2015-05-24 HISTORY — DX: Chlamydial infection, unspecified: A74.9

## 2015-05-24 HISTORY — DX: Trichomoniasis, unspecified: A59.9

## 2015-05-24 LAB — URINE MICROSCOPIC-ADD ON: BACTERIA UA: NONE SEEN

## 2015-05-24 LAB — CBC WITH DIFFERENTIAL/PLATELET
BASOS ABS: 0 10*3/uL (ref 0.0–0.1)
Basophils Relative: 0 %
Eosinophils Absolute: 0.1 10*3/uL (ref 0.0–0.7)
Eosinophils Relative: 1 %
HEMATOCRIT: 34.9 % — AB (ref 36.0–46.0)
HEMOGLOBIN: 11.1 g/dL — AB (ref 12.0–15.0)
LYMPHS PCT: 38 %
Lymphs Abs: 2.8 10*3/uL (ref 0.7–4.0)
MCH: 28.3 pg (ref 26.0–34.0)
MCHC: 31.8 g/dL (ref 30.0–36.0)
MCV: 89 fL (ref 78.0–100.0)
MONO ABS: 0.7 10*3/uL (ref 0.1–1.0)
Monocytes Relative: 9 %
NEUTROS ABS: 3.8 10*3/uL (ref 1.7–7.7)
Neutrophils Relative %: 52 %
Platelets: 383 10*3/uL (ref 150–400)
RBC: 3.92 MIL/uL (ref 3.87–5.11)
RDW: 13.8 % (ref 11.5–15.5)
WBC: 7.3 10*3/uL (ref 4.0–10.5)

## 2015-05-24 LAB — URINALYSIS, ROUTINE W REFLEX MICROSCOPIC
BILIRUBIN URINE: NEGATIVE
Glucose, UA: NEGATIVE mg/dL
KETONES UR: NEGATIVE mg/dL
Leukocytes, UA: NEGATIVE
Nitrite: NEGATIVE
PROTEIN: NEGATIVE mg/dL
Specific Gravity, Urine: 1.015 (ref 1.005–1.030)
pH: 8.5 — ABNORMAL HIGH (ref 5.0–8.0)

## 2015-05-24 LAB — WET PREP, GENITAL
SPERM: NONE SEEN
YEAST WET PREP: NONE SEEN

## 2015-05-24 LAB — POCT PREGNANCY, URINE: Preg Test, Ur: NEGATIVE

## 2015-05-24 MED ORDER — METRONIDAZOLE 500 MG PO TABS: 500.0000 mg | ORAL_TABLET | Freq: Two times a day (BID) | ORAL | Status: DC

## 2015-05-24 NOTE — MAU Note (Signed)
Pelvic pain started yesterday. Then started spotting. Took Aleve with no relief.

## 2015-05-24 NOTE — Discharge Instructions (Signed)
Trichomoniasis Trichomoniasis is an infection caused by an organism called Trichomonas. The infection can affect both women and men. In women, the outer female genitalia and the vagina are affected. In men, the penis is mainly affected, but the prostate and other reproductive organs can also be involved. Trichomoniasis is a sexually transmitted infection (STI) and is most often passed to another person through sexual contact.  RISK FACTORS  Having unprotected sexual intercourse.  Having sexual intercourse with an infected partner. SIGNS AND SYMPTOMS  Symptoms of trichomoniasis in women include:  Abnormal gray-green frothy vaginal discharge.  Itching and irritation of the vagina.  Itching and irritation of the area outside the vagina. Symptoms of trichomoniasis in men include:   Penile discharge with or without pain.  Pain during urination. This results from inflammation of the urethra. DIAGNOSIS  Trichomoniasis may be found during a Pap test or physical exam. Your health care provider may use one of the following methods to help diagnose this infection:  Testing the pH of the vagina with a test tape.  Using a vaginal swab test that checks for the Trichomonas organism. A test is available that provides results within a few minutes.  Examining a urine sample.  Testing vaginal secretions. Your health care provider may test you for other STIs, including HIV. TREATMENT   You may be given medicine to fight the infection. Women should inform their health care provider if they could be or are pregnant. Some medicines used to treat the infection should not be taken during pregnancy.  Your health care provider may recommend over-the-counter medicines or creams to decrease itching or irritation.  Your sexual partner will need to be treated if infected.  Your health care provider may test you for infection again 3 months after treatment. HOME CARE INSTRUCTIONS   Take medicines only as  directed by your health care provider.  Take over-the-counter medicine for itching or irritation as directed by your health care provider.  Do not have sexual intercourse while you have the infection.  Women should not douche or wear tampons while they have the infection.  Discuss your infection with your partner. Your partner may have gotten the infection from you, or you may have gotten it from your partner.  Have your sex partner get examined and treated if necessary.  Practice safe, informed, and protected sex.  See your health care provider for other STI testing. SEEK MEDICAL CARE IF:   You still have symptoms after you finish your medicine.  You develop abdominal pain.  You have pain when you urinate.  You have bleeding after sexual intercourse.  You develop a rash.  Your medicine makes you sick or makes you throw up (vomit). MAKE SURE YOU:  Understand these instructions.  Will watch your condition.  Will get help right away if you are not doing well or get worse.   This information is not intended to replace advice given to you by your health care provider. Make sure you discuss any questions you have with your health care provider.   Document Released: 10/02/2000 Document Revised: 04/29/2014 Document Reviewed: 01/18/2013 Elsevier Interactive Patient Education 2016 [employer]  

## 2015-05-24 NOTE — MAU Provider Note (Signed)
History     CSN: [id number]  Arrival date and time: 05/24/15 1620   First Provider Initiated Contact with Patient 05/24/15 1659      Chief Complaint  Patient presents with  . Pelvic Pain   HPI  Rose Gutierrez 35 y.o.  is a [phone] nonpregnant female who arrives complaining of pelvic pain and spotting that she noticed yesterday after using the bathroom. She reports that prior to noticing this she was nauseous, felt sick to her stomach, and felt an pressure she describes as feeling liker her "stomach would push out of my vagina." Reports a subjective fever with chills yesterday. Pt Noticed light red blood on the toilet paper and says that the color has remained the same, however, the amount of blood has increased. Still spotting today but has not been using a pad or panty liner. Notes lower abdominal/suprapubic pain radiating to the right flank that is intermittent and described as burning and sharp. She rates this pain a 7.0/10.The pain becomes intense especially after peeing and was not alleviated with OTC Aleve 2 tablets po. LMP was 05/05/15 and is typically regular. Pt Recently became sexually active again with one partner, did not use protection and is not currently using any form of contraception. Admits to dysuria, frequency, urgency, and oliguria. Denies hematuria vomiting, diarrhea, and vaginal discharge, itching and burning.   Pertinent Gynecological History: Menses: usually lasting 5 to 7 days Bleeding: Pt. Reports heavy bleeding during menses Contraception: none Sexually transmitted diseases: prev hx of GC/C    Past Medical History  Diagnosis Date  . Hypertension   . Asthma   . Anxiety   . Sleep apnea   . Depression   . Suicidal ideation     Past Surgical History  Procedure Laterality Date  . Cesearean section    . Cesarean section      Family History  Problem Relation Age of Onset  . Cancer Mother     breast    Social History  Substance Use Topics  . Smoking  status: Current Every Day Smoker -- 0.25 packs/day for 15 years    Types: Cigarettes  . Smokeless tobacco: Never Used  . Alcohol Use: Yes     Comment: occasionally     Allergies: No Known Allergies  Prescriptions prior to admission  Medication Sig Dispense Refill Last Dose  . naproxen sodium (ALEVE) 220 MG tablet Take 440 mg by mouth daily as needed (pain).   Past Week at Unknown time  . ARIPiprazole (ABILIFY) 5 MG tablet Take 1 tablet (5 mg total) by mouth daily. (Patient not taking: Reported on 05/24/2015) 30 tablet 0   . betamethasone dipropionate (DIPROLENE) 0.05 % ointment Apply topically 2 (two) times daily. (Patient not taking: Reported on 05/24/2015) 45 g 0   . fluticasone (CUTIVATE) 0.05 % cream Apply topically 2 (two) times daily. (Patient not taking: Reported on 05/24/2015) 30 g 0 Past Month at Unknown time  . hydrochlorothiazide (HYDRODIURIL) 25 MG tablet Take 1 tablet (25 mg total) by mouth daily. (Patient not taking: Reported on 07/19/2014) 30 tablet 11 Not Taking at Unknown time  . sertraline (ZOLOFT) 100 MG tablet Take 1 tablet (100 mg total) by mouth daily. (Patient not taking: Reported on 05/24/2015) 30 tablet 0     Review of Systems  Cardiovascular: Negative for chest pain.  Gastrointestinal: Negative for constipation.   Physical Exam   Blood pressure 137/91, pulse 79, temperature 98.3 F (36.8 C), temperature source Oral, resp. rate 18, height  5' 4.5" (1.638 m), weight 295 lb (133.811 kg), last menstrual period 05/05/2015.  Physical Exam  Constitutional: She is oriented to person, place, and time. She appears well-developed and well-nourished.  HENT:  Head: Normocephalic and atraumatic.  Eyes: EOM are normal. Right eye exhibits no discharge. Left eye exhibits no discharge. No scleral icterus.  Neck: Neck supple.  Cardiovascular: Normal rate, regular rhythm and normal heart sounds.  Exam reveals no gallop and no friction rub.   No murmur heard. Respiratory: Effort normal  and breath sounds normal.  GI: Soft. She exhibits no mass. There is tenderness. There is guarding.  Genitourinary: Vaginal discharge found.  Generalized discomfort to all portions of the bimanual exam. Moderate, white, watery vaginal discharge.  Neurological: She is alert and oriented to person, place, and time.  Skin: Skin is warm and dry.  Psychiatric: She has a normal mood and affect. Her behavior is normal. Judgment and thought content normal.    MAU Course  Procedures  MDM Wet prep and GC/C swabs collected U/A negative BV and Trichomonas on wet prep  Assessment and Plan  A:  1. Trichomonas vaginitis    P: Discharge to home Flagyl bid x 1 week Refer partners for treatment No etoh/IC x 10 days.   Patient may return to MAU as needed or if her condition were to change or worsen   Bertram Denver 05/24/2015, 6:09 PM

## 2015-05-25 LAB — GC/CHLAMYDIA PROBE AMP (~~LOC~~) NOT AT ARMC
CHLAMYDIA, DNA PROBE: NEGATIVE
NEISSERIA GONORRHEA: NEGATIVE

## 2015-12-05 ENCOUNTER — Ambulatory Visit (HOSPITAL_COMMUNITY)
Admission: EM | Admit: 2015-12-05 | Discharge: 2015-12-05 | Disposition: A | Discharge status: Home / Self Care | Payer: Medicaid Other | Attending: Family Medicine | Admitting: Family Medicine

## 2015-12-05 ENCOUNTER — Encounter (HOSPITAL_COMMUNITY): Payer: Self-pay | Admitting: Family Medicine

## 2015-12-05 DIAGNOSIS — M940 Chondrocostal junction syndrome [Tietze]: Secondary | ICD-10-CM

## 2015-12-05 DIAGNOSIS — R0789 Other chest pain: Secondary | ICD-10-CM

## 2015-12-05 DIAGNOSIS — R1310 Dysphagia, unspecified: Secondary | ICD-10-CM

## 2015-12-05 MED ORDER — RANITIDINE HCL 150 MG PO CAPS: 150.0000 mg | ORAL_CAPSULE | Freq: Two times a day (BID) | ORAL | 0 refills | Status: DC

## 2015-12-05 MED ORDER — OMEPRAZOLE 20 MG PO CPDR: 20.0000 mg | DELAYED_RELEASE_CAPSULE | Freq: Every day | ORAL | 0 refills | Status: DC

## 2015-12-05 MED ORDER — SUCRALFATE 1 G PO TABS: 1.0000 g | ORAL_TABLET | Freq: Two times a day (BID) | ORAL | 0 refills | Status: DC

## 2015-12-05 NOTE — ED Provider Notes (Signed)
CSN: [id number]     Arrival date & time 12/05/15  1120 History   First MD Initiated Contact with Patient 12/05/15 1211     Chief Complaint  Patient presents with  . Dysphagia   (Consider location/radiation/quality/duration/timing/severity/associated sxs/prior Treatment) 35 year old obese female complaining of odynophagia as well as chest wall pain. Symptoms began yesterday. She states that when swallowing liquids of any type or foods this produces pain that starts at the upper sternum and then makes it way down to the stomach and she swallows. The pain then slowly abates.  Second complaint is of pain around the sternum associated with coughing and taking deep breaths.  Denies taking any medications and specifically NSAIDs. She has a history of GERD symptoms.      Past Medical History:  Diagnosis Date  . Anxiety   . Asthma   . Chlamydia   . Depression   . Hypertension   . Sleep apnea   . Suicidal ideation   . Trichomonas infection    Past Surgical History:  Procedure Laterality Date  . CESAREAN SECTION    . cesearean section     Family History  Problem Relation Age of Onset  . Cancer Mother     breast   Social History  Substance Use Topics  . Smoking status: Current Every Day Smoker    Packs/day: 0.25    Years: 15.00    Types: Cigarettes  . Smokeless tobacco: Never Used  . Alcohol use Yes     Comment: occasionally    OB History    Gravida Para Term Preterm AB Living   4 3 3  0 1 3   SAB TAB Ectopic Multiple Live Births   1 0 0 0       Review of Systems  Constitutional: Negative for activity change, fatigue and fever.  HENT: Negative for congestion, ear discharge, rhinorrhea, sneezing, sore throat and trouble swallowing.   Eyes: Negative.   Respiratory: Negative for choking, chest tightness, shortness of breath, wheezing and stridor.   Cardiovascular: Positive for chest pain. Negative for palpitations and leg swelling.  Gastrointestinal: Negative for  abdominal distention, abdominal pain, constipation, diarrhea, nausea, rectal pain and vomiting.  Genitourinary: Negative.   Skin: Negative.   Neurological: Negative.   All other systems reviewed and are negative.   Allergies  Review of patient's allergies indicates no known allergies.  Home Medications   Prior to Admission medications   Medication Sig Start Date End Date Taking? Authorizing Provider  omeprazole (PRILOSEC) 20 MG capsule Take 1 capsule (20 mg total) by mouth daily. 12/05/15   Hayden Rasmussenavid Chennel Olivos, NP  ranitidine (ZANTAC) 150 MG capsule Take 1 capsule (150 mg total) by mouth 2 (two) times daily. 12/05/15   Hayden Rasmussenavid Erlean Mealor, NP  sucralfate (CARAFATE) 1 g tablet Take 1 tablet (1 g total) by mouth 2 (two) times daily. With meals and hs 12/05/15   Hayden Rasmussenavid Norleen Xie, NP   Meds Ordered and Administered this Visit  Medications - No data to display  BP 156/95   Pulse 73   Temp 98.6 F (37 C)   Resp 18   LMP 11/07/2015   SpO2 99%  No data found.   Physical Exam  Constitutional: She is oriented to person, place, and time. She appears well-developed and well-nourished. No distress.  HENT:  Oropharynx is clear. No erythema or exudates.  Eyes: EOM are normal.  Neck: Normal range of motion. Neck supple.  Swallow reflexes normal. No palpable thyroid nodules or enlargement.  Cardiovascular: Normal rate, regular rhythm and normal heart sounds.   Reproducible chest wall pain and tenderness with palpation to the right and left parasternal borders.  Pulmonary/Chest: Effort normal and breath sounds normal. No respiratory distress. She has no wheezes. She has no rales. She exhibits tenderness.  Abdominal: Soft. Bowel sounds are normal. She exhibits no mass. There is no tenderness. There is no rebound and no guarding. No hernia.  Musculoskeletal: Normal range of motion. She exhibits no edema.  Lymphadenopathy:    She has no cervical adenopathy.  Neurological: She is alert and oriented to person, place, and  time. She exhibits normal muscle tone.  Skin: Skin is warm and dry.  Psychiatric: She has a normal mood and affect.  Nursing note and vitals reviewed.   Urgent Care Course   Clinical Course    Procedures (including critical care time)  Labs Review Labs Reviewed - No data to display  Imaging Review No results found.   Visual Acuity Review  Right Eye Distance:   Left Eye Distance:   Bilateral Distance:    Right Eye Near:   Left Eye Near:    Bilateral Near:         MDM   1. Odynophagia   2. Costochondritis   3. Chest wall pain    Dysphagia Likely esophagitis.   Avoid acidic foods, fried foods and rough food such as salads for now. Drink plenty of cool water and eat soft mechanical foods. Take your medications as directed. Do not take NSAIDs such as Aleve or Motrin. Follow-up with your primary care doctor and call for appointment. For any worsening, new symptoms or problems such as bleeding or vomiting sig medical attention promptly. Meds ordered this encounter  Medications  . sucralfate (CARAFATE) 1 g tablet    Sig: Take 1 tablet (1 g total) by mouth 2 (two) times daily. With meals and hs    Dispense:  45 tablet    Refill:  0    Order Specific Question:   Supervising Provider    Answer:   Linna HoffKINDL, JAMES D [phone][[id number]]  . omeprazole (PRILOSEC) 20 MG capsule    Sig: Take 1 capsule (20 mg total) by mouth daily.    Dispense:  30 capsule    Refill:  0    Order Specific Question:   Supervising Provider    Answer:   Linna HoffKINDL, JAMES D [phone][[id number]]  . ranitidine (ZANTAC) 150 MG capsule    Sig: Take 1 capsule (150 mg total) by mouth 2 (two) times daily.    Dispense:  30 capsule    Refill:  0    Order Specific Question:   Supervising Provider    Answer:   Linna HoffKINDL, JAMES D [[id number]]       Hayden Rasmussenavid Mariamawit Depaoli, NP 12/05/15 1250    Hayden Rasmussenavid Kam Kushnir, NP 12/05/15 1252

## 2015-12-05 NOTE — ED Triage Notes (Signed)
Pt here for a pain in her chest with swallowing food and liquids. sts she doesn't remember anything getting stuck after eating yesterday but the pain started yesterday. Denies any N,V. sts the pain is also there with deep breathing

## 2017-05-23 ENCOUNTER — Encounter (HOSPITAL_COMMUNITY): Payer: Self-pay | Admitting: *Deleted

## 2017-05-23 ENCOUNTER — Other Ambulatory Visit: Payer: Self-pay

## 2017-05-23 ENCOUNTER — Emergency Department (HOSPITAL_COMMUNITY)
Admission: EM | Admit: 2017-05-23 | Discharge: 2017-05-23 | Disposition: A | Discharge status: Home / Self Care | Payer: BLUE CROSS/BLUE SHIELD | Attending: Emergency Medicine | Admitting: Emergency Medicine

## 2017-05-23 DIAGNOSIS — R0981 Nasal congestion: Secondary | ICD-10-CM | POA: Diagnosis not present

## 2017-05-23 DIAGNOSIS — R69 Illness, unspecified: Secondary | ICD-10-CM

## 2017-05-23 DIAGNOSIS — R51 Headache: Secondary | ICD-10-CM | POA: Diagnosis not present

## 2017-05-23 DIAGNOSIS — I10 Essential (primary) hypertension: Secondary | ICD-10-CM | POA: Diagnosis not present

## 2017-05-23 DIAGNOSIS — Z79899 Other long term (current) drug therapy: Secondary | ICD-10-CM | POA: Insufficient documentation

## 2017-05-23 DIAGNOSIS — J45909 Unspecified asthma, uncomplicated: Secondary | ICD-10-CM | POA: Diagnosis not present

## 2017-05-23 DIAGNOSIS — J111 Influenza due to unidentified influenza virus with other respiratory manifestations: Secondary | ICD-10-CM | POA: Diagnosis not present

## 2017-05-23 DIAGNOSIS — F17210 Nicotine dependence, cigarettes, uncomplicated: Secondary | ICD-10-CM | POA: Diagnosis not present

## 2017-05-23 DIAGNOSIS — R05 Cough: Secondary | ICD-10-CM | POA: Diagnosis not present

## 2017-05-23 DIAGNOSIS — R0982 Postnasal drip: Secondary | ICD-10-CM | POA: Diagnosis not present

## 2017-05-23 LAB — I-STAT BETA HCG BLOOD, ED (MC, WL, AP ONLY): I-stat hCG, quantitative: <5 m[IU]/mL (ref <5)

## 2017-05-23 MED ORDER — BENZONATATE 100 MG PO CAPS: 200.0000 mg | ORAL_CAPSULE | Freq: Three times a day (TID) | ORAL | 0 refills | Status: DC

## 2017-05-23 MED ORDER — OSELTAMIVIR PHOSPHATE 75 MG PO CAPS: 75.0000 mg | ORAL_CAPSULE | Freq: Two times a day (BID) | ORAL | 0 refills | Status: DC

## 2017-05-23 MED ORDER — ACETAMINOPHEN 325 MG PO TABS
650.0000 mg | ORAL_TABLET | Freq: Once | ORAL | Status: AC | PRN
  Administered 2017-05-23: 650 mg via ORAL
  Filled 2017-05-23: qty 2

## 2017-05-23 MED ORDER — ONDANSETRON 4 MG PO TBDP: 4.0000 mg | ORAL_TABLET | Freq: Three times a day (TID) | ORAL | 0 refills | Status: DC | PRN

## 2017-05-23 MED ORDER — SODIUM CHLORIDE 0.9 % IV BOLUS (SEPSIS)
500.0000 mL | Freq: Once | INTRAVENOUS | Status: AC
  Administered 2017-05-23: 500 mL via INTRAVENOUS

## 2017-05-23 MED ORDER — KETOROLAC TROMETHAMINE 30 MG/ML IJ SOLN
30.0000 mg | Freq: Once | INTRAMUSCULAR | Status: AC
  Administered 2017-05-23: 30 mg via INTRAVENOUS
  Filled 2017-05-23: qty 1

## 2017-05-23 MED ORDER — SODIUM CHLORIDE 0.9 % IV BOLUS (SEPSIS)
1000.0000 mL | Freq: Once | INTRAVENOUS | Status: AC
  Administered 2017-05-23: 1000 mL via INTRAVENOUS

## 2017-05-23 MED ORDER — OSELTAMIVIR PHOSPHATE 75 MG PO CAPS
75.0000 mg | ORAL_CAPSULE | Freq: Once | ORAL | Status: AC
  Administered 2017-05-23: 75 mg via ORAL
  Filled 2017-05-23: qty 1

## 2017-05-23 MED ORDER — NAPROXEN 500 MG PO TABS: 500.0000 mg | ORAL_TABLET | Freq: Two times a day (BID) | ORAL | 0 refills | Status: DC

## 2017-05-23 NOTE — Discharge Instructions (Signed)
Please read attached information regarding your condition. Take Tamiflu beginning today. Take Tessalon Perles as needed for cough.  Take Zofran as needed for nausea.  Take naproxen as needed for fever and body aches. Please fluids to maintain adequate hydration and slowly advance her diet. Return to work after your fever has resolved. Return to ED for worsening symptoms, chest pain, shortness of breath, severe abdominal pain, lightheadedness or loss of consciousness.

## 2017-05-23 NOTE — ED Provider Notes (Signed)
[facility] Provider Note   CSN: [id number] Arrival date & time: 05/23/17  0803     History   Chief Complaint Chief Complaint  Patient presents with  . Influenza  . Diarrhea    HPI Rose Gutierrez is a 37 y.o. female with a past medical history of asthma, hypertension, anxiety, who presents to ED for evaluation of influenza-like illness since yesterday.  She reports nonproductive cough, headache, body aches, subjective fever since yesterday and diarrhea that began this morning.  She has been taking over-the-counter generic TheraFlu with no improvement in her symptoms.  She reports sick contacts at home with similar symptoms.  She did not receive her influenza vaccine this year.  She denies chest pain, shortness of breath, hemoptysis, abdominal pain, vomiting or blood in stool.  HPI  Past Medical History:  Diagnosis Date  . Anxiety   . Asthma   . Chlamydia   . Depression   . Hypertension   . Sleep apnea   . Suicidal ideation   . Trichomonas infection     Patient Active Problem List   Diagnosis Date Noted  . Schizoaffective disorder, bipolar type (HCC) 08/27/2014  . Suicidal ideation   . Morbid obesity (HCC) 04/29/2014  . HTN (hypertension) 04/29/2014  . Labial cyst 04/29/2014    Past Surgical History:  Procedure Laterality Date  . CESAREAN SECTION    . cesearean section      OB History    Gravida Para Term Preterm AB Living   4 3 3  0 1 3   SAB TAB Ectopic Multiple Live Births   1 0 0 0         Home Medications    Prior to Admission medications   Medication Sig Start Date End Date Taking? Authorizing Provider  Phenylephrine-Pheniramine-DM [facility](THERAFLU COLD & COUGH) 02-09-19 MG PACK Take 1 packet by mouth daily as needed (flu like symptoms).   Yes [provider]  benzonatate (TESSALON) 100 MG capsule Take 2 capsules (200 mg total) by mouth every 8 (eight) hours. 05/23/17   Darnice Comrie, PA-C  naproxen (NAPROSYN)  500 MG tablet Take 1 tablet (500 mg total) by mouth 2 (two) times daily. 05/23/17   Camarie Mctigue, PA-C  omeprazole (PRILOSEC) 20 MG capsule Take 1 capsule (20 mg total) by mouth daily. Patient not taking: Reported on 05/23/2017 12/05/15   Hayden RasmussenMabe, David, NP  ondansetron (ZOFRAN ODT) 4 MG disintegrating tablet Take 1 tablet (4 mg total) by mouth every 8 (eight) hours as needed for nausea or vomiting. 05/23/17   Shannen Flansburg, PA-C  oseltamivir (TAMIFLU) 75 MG capsule Take 1 capsule (75 mg total) by mouth every 12 (twelve) hours. 05/23/17   Jordana Dugue, PA-C  ranitidine (ZANTAC) 150 MG capsule Take 1 capsule (150 mg total) by mouth 2 (two) times daily. Patient not taking: Reported on 05/23/2017 12/05/15   Hayden RasmussenMabe, David, NP  sucralfate (CARAFATE) 1 g tablet Take 1 tablet (1 g total) by mouth 2 (two) times daily. With meals and hs Patient not taking: Reported on 05/23/2017 12/05/15   Hayden RasmussenMabe, David, NP    Family History Family History  Problem Relation Age of Onset  . Cancer Mother        breast    Social History Social History   Tobacco Use  . Smoking status: Current Every Day Smoker    Packs/day: 0.25    Years: 15.00    Pack years: 3.75    Types: Cigarettes  . Smokeless  tobacco: Never Used  Substance Use Topics  . Alcohol use: Yes    Comment: occasionally   . Drug use: No     Allergies   Patient has no known allergies.   Review of Systems Review of Systems  Constitutional: Positive for fever. Negative for appetite change and chills.  HENT: Positive for congestion and postnasal drip. Negative for ear pain, rhinorrhea, sneezing, sore throat and trouble swallowing.   Eyes: Negative for photophobia and visual disturbance.  Respiratory: Positive for cough. Negative for chest tightness, shortness of breath and wheezing.   Cardiovascular: Negative for chest pain and palpitations.  Gastrointestinal: Positive for nausea. Negative for abdominal pain, blood in stool, constipation, diarrhea and vomiting.    Genitourinary: Negative for dysuria, hematuria and urgency.  Musculoskeletal: Positive for myalgias.  Skin: Negative for rash.  Neurological: Positive for headaches. Negative for dizziness, weakness and light-headedness.     Physical Exam Updated Vital Signs BP (!) 150/93   Pulse 100   Temp 100.3 F (37.9 C) (Oral)   Resp 20   LMP 05/06/2017   SpO2 96%   Physical Exam  Constitutional: She appears well-developed and well-nourished. No distress.  Nontoxic appearing and in no acute distress.  Dry cough noted on examination.  HENT:  Head: Normocephalic and atraumatic.  Right Ear: Tympanic membrane normal.  Left Ear: Tympanic membrane normal.  Nose: Rhinorrhea present.  Mouth/Throat: No posterior oropharyngeal edema or posterior oropharyngeal erythema. No tonsillar exudate.  Postnasal drainage seen.  Eyes: Conjunctivae and EOM are normal. Right eye exhibits no discharge. Left eye exhibits no discharge. No scleral icterus.  Neck: Normal range of motion. Neck supple.  Cardiovascular: Normal rate, regular rhythm, normal heart sounds and intact distal pulses. Exam reveals no gallop and no friction rub.  No murmur heard. Pulmonary/Chest: Effort normal and breath sounds normal. No respiratory distress.  Abdominal: Soft. Bowel sounds are normal. She exhibits no distension. There is no tenderness. There is no guarding.  Musculoskeletal: Normal range of motion. She exhibits no edema.  Neurological: She is alert. She exhibits normal muscle tone. Coordination normal.  Skin: Skin is warm and dry. No rash noted.  Psychiatric: She has a normal mood and affect.  Nursing note and vitals reviewed.    ED Treatments / Results  Labs (all labs ordered are listed, but only abnormal results are displayed) Labs Reviewed  I-STAT BETA HCG BLOOD, ED ([facility], [facility], [facility] ONLY)    EKG  EKG Interpretation None       Radiology No results found.  Procedures Procedures (including critical care  time)  Medications Ordered in ED Medications  acetaminophen (TYLENOL) tablet 650 mg (650 mg Oral Given 05/23/17 0835)  sodium chloride 0.9 % bolus 1,000 mL (0 mLs Intravenous Stopped 05/23/17 1034)  ketorolac (TORADOL) 30 MG/ML injection 30 mg (30 mg Intravenous Given 05/23/17 1031)  oseltamivir (TAMIFLU) capsule 75 mg (75 mg Oral Given 05/23/17 1030)  sodium chloride 0.9 % bolus 500 mL (500 mLs Intravenous New Bag/Given 05/23/17 1126)     Initial Impression / Assessment and Plan / ED Course  I have reviewed the triage vital signs and the nursing notes.  Pertinent labs & imaging results that were available during my care of the patient were reviewed by me and considered in my medical decision making (see chart for details).  Clinical Course as of May 23 1140  Fri May 23, 2017  6258 37 year old female with multiple sick contacts at work started yesterday with acute onset of body  aches cough diarrhea.  Here she is tachycardic and mildly ill appearing in no distress.  She is getting some IV fluids.  She likely be discharged to follow-up with her doctor with symptomatic treatment.  [MB]    Clinical Course User Index [MB] Terrilee Files, MD    Patient presents to ED for evaluation of influenza-like symptoms since yesterday.  She was mildly tachycardic on arrival and febrile to 102.7.  This resolved with supportive measures given here including fluids, Toradol, Tylenol and first dose of Tamiflu.  Patient states that she does want to proceed with Tamiflu course after discussing risk and benefits.  She is suffering from diarrhea that began today and I told her this could potentially worsen the diarrhea.  Lungs are clear to auscultation bilaterally and I have low suspicion for pneumonia or other pulmonary cause of her symptoms.  She is still in the window for Tamiflu administration.  Will give symptomatic treatment with antitussives, antiemetics, anti-inflammatories to be taken as directed.  Advised to  follow-up for any severe or worsening symptoms.  Portions of this note were generated with Scientist, clinical (histocompatibility and immunogenetics). Dictation errors may occur despite best attempts at proofreading.   Final Clinical Impressions(s) / ED Diagnoses   Final diagnoses:  Influenza-like illness    ED Discharge Orders        Ordered    oseltamivir (TAMIFLU) 75 MG capsule  Every 12 hours     05/23/17 1138    benzonatate (TESSALON) 100 MG capsule  Every 8 hours     05/23/17 1138    ondansetron (ZOFRAN ODT) 4 MG disintegrating tablet  Every 8 hours PRN     05/23/17 1138    naproxen (NAPROSYN) 500 MG tablet  2 times daily     05/23/17 1138       Dietrich Pates, PA-C 05/23/17 1143    Terrilee Files, MD 05/24/17 [phone]

## 2017-05-23 NOTE — ED Triage Notes (Signed)
Pt reports having flu like symptoms since yesterday. Has headache, non productive cough, fever and body aches. Also reports diarrhea. Mask on pt at triage.

## 2017-12-03 ENCOUNTER — Ambulatory Visit (INDEPENDENT_AMBULATORY_CARE_PROVIDER_SITE_OTHER): Payer: BLUE CROSS/BLUE SHIELD | Admitting: Obstetrics & Gynecology

## 2017-12-03 ENCOUNTER — Encounter: Payer: Self-pay | Admitting: Obstetrics & Gynecology

## 2017-12-03 VITALS — BP 165/94 | HR 75 | Wt 278.0 lb

## 2017-12-03 DIAGNOSIS — Z01419 Encounter for gynecological examination (general) (routine) without abnormal findings: Secondary | ICD-10-CM

## 2017-12-03 DIAGNOSIS — Z124 Encounter for screening for malignant neoplasm of cervix: Secondary | ICD-10-CM

## 2017-12-03 DIAGNOSIS — Z23 Encounter for immunization: Secondary | ICD-10-CM

## 2017-12-03 DIAGNOSIS — F439 Reaction to severe stress, unspecified: Secondary | ICD-10-CM

## 2017-12-03 DIAGNOSIS — Z113 Encounter for screening for infections with a predominantly sexual mode of transmission: Secondary | ICD-10-CM

## 2017-12-03 DIAGNOSIS — Z1151 Encounter for screening for human papillomavirus (HPV): Secondary | ICD-10-CM

## 2017-12-03 DIAGNOSIS — I10 Essential (primary) hypertension: Secondary | ICD-10-CM

## 2017-12-03 DIAGNOSIS — N898 Other specified noninflammatory disorders of vagina: Secondary | ICD-10-CM | POA: Diagnosis not present

## 2017-12-03 MED ORDER — TETANUS-DIPHTH-ACELL PERTUSSIS 5-2.5-18.5 LF-MCG/0.5 IM SUSP
0.5000 mL | Freq: Once | INTRAMUSCULAR | Status: AC
  Administered 2017-12-03: 0.5 mL via INTRAMUSCULAR

## 2017-12-03 NOTE — Progress Notes (Signed)
Subjective:    Rose Gutierrez is a 37 y.o. single P3 ( 9, 5411, and 35 yo kids)  female who presents for an annual exam. The patient has no complaints today. She sometimes has some vulvar rashes and would like a test for HSV. She would like routine STI testing also.  The patient is sexually active, but just now no partner.  GYN screening history: last pap: was normal. The patient wears seatbelts: yes. The patient participates in regular exercise: no. Has the patient ever been transfused or tattooed?: yes. The patient reports that there is not domestic violence in her life.   Menstrual History: OB History    Gravida  4   Para  3   Term  3   Preterm  0   AB  1   Living  3     SAB  1   TAB  0   Ectopic  0   Multiple  0   Live Births              Menarche age: 89 Patient's last menstrual period was 11/06/2017 (approximate).    The following portions of the patient's history were reviewed and updated as appropriate: allergies, current medications, past family history, past medical history, past social history, past surgical history and problem list.  Review of Systems Pertinent items are noted in HPI.   FH- + breast cancer in her mom (diagnosed in her 3240s), maybe her maternal GM, no gyn or colon cancer International aid/development workerAssistant manager at [employer]    Objective:    BP (!) 165/94   Pulse 75   Wt 278 lb (126.1 kg)   LMP 11/06/2017 (Approximate)   BMI 46.98 kg/m   General Appearance:    Alert, cooperative, no distress, appears stated age  Head:    Normocephalic, without obvious abnormality, atraumatic  Eyes:    PERRL, conjunctiva/corneas clear, EOM's intact, fundi    benign, both eyes  Ears:    Normal TM's and external ear canals, both ears  Nose:   Nares normal, septum midline, mucosa normal, no drainage    or sinus tenderness  Throat:   Lips, mucosa, and tongue normal; teeth and gums normal  Neck:   Supple, symmetrical, trachea midline, no adenopathy;    thyroid:  no  enlargement/tenderness/nodules; no carotid   bruit or JVD  Back:     Symmetric, no curvature, ROM normal, no CVA tenderness  Lungs:     Clear to auscultation bilaterally, respirations unlabored  Chest Wall:    No tenderness or deformity   Heart:    Regular rate and rhythm, S1 and S2 normal, no murmur, rub   or gallop  Breast Exam:    No tenderness, masses, or nipple abnormality  Abdomen:     Soft, non-tender, bowel sounds active all four quadrants,    no masses, no organomegaly  Genitalia:    Normal female without lesion, discharge or tenderness, there is a 7 mm condyloma on her left labia majora, normal size and shape, anteverted, mobile, non-tender, normal adnexal exam, somewhat frothy cervical discharge     Extremities:   Extremities normal, atraumatic, no cyanosis or edema  Pulses:   2+ and symmetric all extremities  Skin:   Skin color, texture, turgor normal, no rashes or lesions  Lymph nodes:   Cervical, supraclavicular, and axillary nodes normal  Neurologic:   CNII-XII intact, normal strength, sensation and reflexes    throughout  .    Assessment:  Healthy female exam.   Elevated score on depression screen (12)   Plan:     Thin prep Pap smear.  with cotesting  STI testing Refer to fam med Refer to integrated behavior health TDAP and Gardasil today Come back 2 months for Gardasil #2

## 2017-12-04 ENCOUNTER — Other Ambulatory Visit: Payer: Self-pay | Admitting: Obstetrics & Gynecology

## 2017-12-04 LAB — HEPATITIS C ANTIBODY: Hep C Virus Ab: <0.1 s/co ratio (ref 0.0–0.9)

## 2017-12-04 LAB — HSV 2 ANTIBODY, IGG: HSV 2 IgG, Type Spec: 17.4 index — ABNORMAL HIGH (ref 0.00–0.90)

## 2017-12-04 LAB — RPR: RPR Ser Ql: NONREACTIVE

## 2017-12-04 LAB — HEPATITIS B SURFACE ANTIGEN: HEP B S AG: NEGATIVE

## 2017-12-04 LAB — HIV ANTIBODY (ROUTINE TESTING W REFLEX): HIV Screen 4th Generation wRfx: NONREACTIVE

## 2017-12-04 MED ORDER — VALACYCLOVIR HCL 1 G PO TABS: 1000.0000 mg | ORAL_TABLET | Freq: Two times a day (BID) | ORAL | 3 refills | Status: DC

## 2017-12-04 NOTE — Progress Notes (Signed)
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

## 2017-12-05 LAB — CYTOLOGY - PAP
Bacterial vaginitis: POSITIVE — AB
CANDIDA VAGINITIS: NEGATIVE
CHLAMYDIA, DNA PROBE: NEGATIVE
Diagnosis: NEGATIVE
HPV (WINDOPATH): NOT DETECTED
Neisseria Gonorrhea: NEGATIVE
Trichomonas: NEGATIVE

## 2017-12-08 ENCOUNTER — Other Ambulatory Visit: Payer: Self-pay | Admitting: Obstetrics & Gynecology

## 2017-12-08 MED ORDER — METRONIDAZOLE 500 MG PO TABS: 500.0000 mg | ORAL_TABLET | Freq: Two times a day (BID) | ORAL | 0 refills | Status: DC

## 2017-12-08 NOTE — Progress Notes (Signed)
Flagyl prescribed for bv seen on pap. Patient notified via Mychart.

## 2017-12-10 ENCOUNTER — Telehealth: Payer: Self-pay | Admitting: Obstetrics & Gynecology

## 2017-12-10 ENCOUNTER — Ambulatory Visit (INDEPENDENT_AMBULATORY_CARE_PROVIDER_SITE_OTHER): Payer: BLUE CROSS/BLUE SHIELD | Admitting: Clinical

## 2017-12-10 DIAGNOSIS — F4323 Adjustment disorder with mixed anxiety and depressed mood: Secondary | ICD-10-CM | POA: Diagnosis not present

## 2017-12-10 NOTE — BH Specialist Note (Signed)
Integrated Behavioral Health Initial Visit  MRN: [id number] Name: Rose Gutierrez  Number of Integrated Behavioral Health Clinician visits:: 1/6 Session Start time: 3:57  Session End time: 4:48 Total time: 45 minutes  Type of Service: Integrated Behavioral Health- Individual/Family Interpretor:No. Interpretor Name and Language: n/a   Warm Hand Off Completed.       SUBJECTIVE: Rose Gutierrez is a 37 y.o. female accompanied by n/a Patient was referred by Rose BloomMyra Dove, MD for stress. Patient reports the following symptoms/concerns: Pt states her primary concern today is life stress, caring for children, managing a restaurant, and "just life stuff". Pt says she has been on medication in the past for depression and anxiety, but prefers coping without medication.  Duration of problem: Symptoms began at 37yo, increase in past few months ; Severity of problem: moderate  OBJECTIVE: Mood: Anxious and Depressed and Affect: Appropriate Risk of harm to self or others: No plan to harm self or others  LIFE CONTEXT: Family and Social: Pt lives with four children (3 hers; 1 she "took in") School/Work: Works full time as Administrator, artsmanager of local restaurant Self-Care: "No time" Life Changes: Continual life changes with children and work  GOALS ADDRESSED: Patient will: 1. Reduce symptoms of: anxiety, depression and stress 2. Increase knowledge and/or ability of: self-management skills  3. Demonstrate ability to: Increase healthy adjustment to current life circumstances  INTERVENTIONS: Interventions utilized: Mindfulness or Management consultantelaxation Training and Psychoeducation and/or Health Education  Standardized Assessments completed: GAD-7 and PHQ 9  ASSESSMENT: Patient currently experiencing Adjustment disorder with mixed anxious and depressed mood   Patient may benefit from psychoeducation and brief therapeutic interventions regarding coping with symptoms of anxiety and depression .  PLAN: 1. Follow up with  behavioral health clinician on : As needed 2. Behavioral recommendations:  -Consider self-coping strategies discussed in office visit today -Read educational materials regarding coping with symptoms of anxiety and depression  3. Referral(s): Integrated Behavioral Health Services (In Clinic) 4. "From scale of 1-10, how likely are you to follow plan?": -  Rae LipsJamie C Oleg Oleson, LCSW  Depression screen [facility] 2[facility] 12/03/2017  Decreased Interest 3  Down, Depressed, Hopeless 2  PHQ - 2 Score 5  Altered sleeping 3  Tired, decreased energy 2  Change in appetite 3  Feeling bad or failure about yourself  2  Trouble concentrating 0  Moving slowly or fidgety/restless 0  Suicidal thoughts 0  PHQ-9 Score 15   GAD 7 : Generalized Anxiety Score 12/03/2017  Nervous, Anxious, on Edge 2  Control/stop worrying 2  Worry too much - different things 3  Trouble relaxing 1  Restless 0  Easily annoyed or irritable 0  Afraid - awful might happen 1  Total GAD 7 Score 9

## 2017-12-10 NOTE — Telephone Encounter (Signed)
Patient said she was told by Dr.Dove that she would see her Today and remove something for her (patient didn't tell me what) after she had her appt with Asher MuirJamie, Patient would like for Dr.Dove to give her a call.

## 2017-12-11 NOTE — Telephone Encounter (Signed)
She would like a wart removed.  I rec'd that she call and get an appt.

## 2018-02-02 ENCOUNTER — Ambulatory Visit (INDEPENDENT_AMBULATORY_CARE_PROVIDER_SITE_OTHER): Payer: BLUE CROSS/BLUE SHIELD | Admitting: *Deleted

## 2018-02-02 DIAGNOSIS — Z289 Immunization not carried out for unspecified reason: Secondary | ICD-10-CM

## 2018-02-02 NOTE — Progress Notes (Signed)
Pt arrived for Gardasil injection as scheduled. She was informed that we do not have the vaccine in stock. Pt voiced understanding and will reschedule her appt for later in the week. Pt also stated that Dr. Marice Potter told her she would perform genital wart treatment at her next visit.  She would like this to be done upon return for Gardasil.  I advised that we can check with Dr. Marice Potter on the Damira Kem of her appt to see if she will have time for the procedure.  Pt voiced understanding.

## 2018-02-06 ENCOUNTER — Encounter: Payer: Self-pay | Admitting: *Deleted

## 2018-02-06 ENCOUNTER — Ambulatory Visit (INDEPENDENT_AMBULATORY_CARE_PROVIDER_SITE_OTHER): Payer: BLUE CROSS/BLUE SHIELD | Admitting: *Deleted

## 2018-02-06 ENCOUNTER — Ambulatory Visit (INDEPENDENT_AMBULATORY_CARE_PROVIDER_SITE_OTHER): Payer: BLUE CROSS/BLUE SHIELD | Admitting: Obstetrics & Gynecology

## 2018-02-06 VITALS — BP 157/95 | HR 77 | Wt 275.4 lb

## 2018-02-06 DIAGNOSIS — A630 Anogenital (venereal) warts: Secondary | ICD-10-CM | POA: Diagnosis not present

## 2018-02-06 DIAGNOSIS — Z23 Encounter for immunization: Secondary | ICD-10-CM

## 2018-02-06 NOTE — Progress Notes (Signed)
Garsasil administered as scheduled.  Pt tolerated well. Pt was advised her BP is elevated and needs to see PCP.  She does not have a PCP.  Dr. Marice Potter informed of BP value today.

## 2018-02-06 NOTE — Progress Notes (Signed)
   Subjective:    Patient ID: Rose Gutierrez, female    DOB: 1980/11/18, 37 y.o.   MRN: [id number]  HPI 37 yo single P3 here for TCA treatment of her wart.   Review of Systems Pap negative 8/19    Objective:   Physical Exam Breathing, conversing, and ambulating normally Well nourished, well hydrated Black female, no apparent distress Left labia majora with a 7 mm wart. I treated it with TCA. She tolerated this well.     Assessment & Plan:  Wart- come back 4 weeks for repeat TCA treatment She had Gardasil #2 today

## 2018-03-23 ENCOUNTER — Encounter (HOSPITAL_COMMUNITY): Payer: Self-pay | Admitting: Emergency Medicine

## 2018-03-23 ENCOUNTER — Ambulatory Visit (HOSPITAL_COMMUNITY)
Admission: EM | Admit: 2018-03-23 | Discharge: 2018-03-23 | Disposition: A | Discharge status: Home / Self Care | Payer: BLUE CROSS/BLUE SHIELD | Attending: Family Medicine | Admitting: Family Medicine

## 2018-03-23 DIAGNOSIS — R519 Headache, unspecified: Secondary | ICD-10-CM

## 2018-03-23 DIAGNOSIS — R51 Headache: Secondary | ICD-10-CM

## 2018-03-23 DIAGNOSIS — R42 Dizziness and giddiness: Secondary | ICD-10-CM

## 2018-03-23 MED ORDER — FLUTICASONE PROPIONATE 50 MCG/ACT NA SUSP: 2.0000 | Freq: Every day | NASAL | 0 refills | Status: DC

## 2018-03-23 NOTE — Discharge Instructions (Signed)
No alarming signs on exam.  To start Flonase for possible sinus pressure causing symptoms. Keep hydrated, your urine should be clear to pale yellow in color.  Monitor closely.  If experiencing worsening symptoms, worse headache in your life, nausea/vomiting, passing out, one-sided weakness, chest pain, shortness of breath, go to the emergency department for further evaluation.  Otherwise follow-up with PCP for further evaluation and management needed.

## 2018-03-23 NOTE — ED Triage Notes (Signed)
Pt sts frontal HA and fatigue x 1 week; pt sts has not taking htn meds x several years

## 2018-03-24 NOTE — ED Provider Notes (Signed)
[facility]    CSN: [id number] Arrival date & time: 03/23/18  4098     History   Chief Complaint Chief Complaint  Patient presents with  . Headache    HPI Rose Gutierrez is a 37 y.o. female.   37 year old female comes in for 1 week history of intermittent headache and fatigue.  States headache is to the frontal area, comes and goes, but has been occurring daily.  Some rhinorrhea, nasal congestion without cough.  Denies fever, chills, night sweats.  Denies weakness, dizziness, syncope.  Denies photophobia, phonophobia, nausea, vomiting.  Current everyday smoker. Not currently with headache.      Past Medical History:  Diagnosis Date  . Anxiety   . Asthma   . Chlamydia   . Depression   . Hypertension   . Sleep apnea   . Suicidal ideation   . Trichomonas infection     Patient Active Problem List   Diagnosis Date Noted  . Schizoaffective disorder, bipolar type (HCC) 08/27/2014  . Suicidal ideation   . Morbid obesity (HCC) 04/29/2014  . HTN (hypertension) 04/29/2014  . Labial cyst 04/29/2014    Past Surgical History:  Procedure Laterality Date  . CESAREAN SECTION    . cesearean section      OB History    Gravida  4   Para  3   Term  3   Preterm  0   AB  1   Living  3     SAB  1   TAB  0   Ectopic  0   Multiple  0   Live Births               Home Medications    Prior to Admission medications   Medication Sig Start Date End Date Taking? Authorizing Provider  fluticasone (FLONASE) 50 MCG/ACT nasal spray Place 2 sprays into both nostrils daily. 03/23/18   Cathie Hoops, Shallon Yaklin V, PA-C  metroNIDAZOLE (FLAGYL) 500 MG tablet Take 1 tablet (500 mg total) by mouth 2 (two) times daily. Patient not taking: Reported on 03/23/2018 12/08/17   Allie Bossier, MD  naproxen (NAPROSYN) 500 MG tablet Take 1 tablet (500 mg total) by mouth 2 (two) times daily. 05/23/17   Khatri, Hina, PA-C  valACYclovir (VALTREX) 1000 MG tablet Take 1 tablet (1,000 mg total) by  mouth 2 (two) times daily. Take for ten days. 12/04/17   Allie Bossier, MD    Family History Family History  Problem Relation Age of Onset  . Cancer Mother        breast    Social History Social History   Tobacco Use  . Smoking status: Current Every Day Smoker    Packs/day: 0.25    Years: 15.00    Pack years: 3.75    Types: Cigarettes  . Smokeless tobacco: Never Used  Substance Use Topics  . Alcohol use: Yes    Comment: occasionally   . Drug use: No     Allergies   Patient has no known allergies.   Review of Systems Review of Systems  Reason unable to perform ROS: See HPI as above.     Physical Exam Triage Vital Signs ED Triage Vitals  Enc Vitals Group     BP 03/23/18 1003 (!) 160/100     Pulse Rate 03/23/18 1003 77     Resp 03/23/18 1003 18     Temp 03/23/18 1003 98.4 F (36.9 C)     Temp Source 03/23/18 1003  Oral     SpO2 03/23/18 1003 100 %     Weight --      Height --      Head Circumference --      Peak Flow --      Pain Score 03/23/18 1008 8     Pain Loc --      Pain Edu? --      Excl. in GC? --    No data found.  Updated Vital Signs BP (!) 160/100 (BP Location: Left Arm)   Pulse 77   Temp 98.4 F (36.9 C) (Oral)   Resp 18   SpO2 100%   Visual Acuity Right Eye Distance:   Left Eye Distance:   Bilateral Distance:    Right Eye Near:   Left Eye Near:    Bilateral Near:     Physical Exam  Constitutional: She is oriented to person, place, and time. She appears well-developed and well-nourished.  Non-toxic appearance. She does not appear ill. No distress.  HENT:  Head: Normocephalic and atraumatic.  Right Ear: Tympanic membrane, external ear and ear canal normal. Tympanic membrane is not erythematous and not bulging.  Left Ear: Tympanic membrane, external ear and ear canal normal. Tympanic membrane is not erythematous and not bulging.  Nose: Right sinus exhibits frontal sinus tenderness. Right sinus exhibits no maxillary sinus  tenderness. Left sinus exhibits frontal sinus tenderness. Left sinus exhibits no maxillary sinus tenderness.  Mouth/Throat: Uvula is midline, oropharynx is clear and moist and mucous membranes are normal.  Eyes: Pupils are equal, round, and reactive to light. Conjunctivae and EOM are normal.  Neck: Normal range of motion. Neck supple.  Cardiovascular: Normal rate, regular rhythm and normal heart sounds. Exam reveals no gallop and no friction rub.  No murmur heard. Pulmonary/Chest: Effort normal and breath sounds normal. She has no decreased breath sounds. She has no wheezes. She has no rhonchi. She has no rales.  Lymphadenopathy:    She has no cervical adenopathy.  Neurological: She is alert and oriented to person, place, and time. She has normal strength. She is not disoriented. No cranial nerve deficit or sensory deficit. She displays a negative Romberg sign. Coordination and gait normal. GCS eye subscore is 4. GCS verbal subscore is 5. GCS motor subscore is 6.  Normal finger-to-nose, rapid movement.  Skin: Skin is warm and dry.  Psychiatric: She has a normal mood and affect. Her behavior is normal. Judgment normal.     UC Treatments / Results  Labs (all labs ordered are listed, but only abnormal results are displayed) Labs Reviewed - No data to display  EKG None  Radiology No results found.  Procedures Procedures (including critical care time)  Medications Ordered in UC Medications - No data to display  Initial Impression / Assessment and Plan / UC Course  I have reviewed the triage vital signs and the nursing notes.  Pertinent labs & imaging results that were available during my care of the patient were reviewed by me and considered in my medical decision making (see chart for details).     Neurology exam grossly intact.  Flonase for possible sinus pressure.  Other symptomatic treatment discussed.  Push fluids.  Return precautions given.  Patient expresses understanding and  agrees to plan.  Final Clinical Impressions(s) / UC Diagnoses   Final diagnoses:  Frontal headache  Lightheadedness    ED Prescriptions    Medication Sig Dispense Auth. Provider   fluticasone (FLONASE) 50 MCG/ACT nasal spray Place  2 sprays into both nostrils daily. 1 g Threasa AlphaYu, Aneka Fagerstrom V, PA-C        Chinwe Lope V, [place] 03/24/18 1236

## 2018-06-04 ENCOUNTER — Ambulatory Visit (INDEPENDENT_AMBULATORY_CARE_PROVIDER_SITE_OTHER): Payer: BLUE CROSS/BLUE SHIELD | Admitting: *Deleted

## 2018-06-04 VITALS — BP 162/99 | HR 84 | Ht 64.5 in | Wt 281.9 lb

## 2018-06-04 DIAGNOSIS — Z23 Encounter for immunization: Secondary | ICD-10-CM | POA: Diagnosis not present

## 2018-06-04 NOTE — Progress Notes (Signed)
Pt presented for 3rd Gardasil injection - administered as scheduled. Pt tolerated well. Pt again has elevated BP today. She is asymptomatic and states she has not looked for a PCP for management of Hypertension although this has been previously discussed with her. Pt was advised of the extreme importance of following up on this medical problem. She voiced understanding and agreed to recommendation. Pt will need next Annual Gyn exam after 12/04/18.

## 2018-06-06 NOTE — Progress Notes (Signed)
I agree with the nurses note and documentation.  Duane Lope, NP 06/06/2018 8:50 AM

## 2019-10-06 ENCOUNTER — Ambulatory Visit: Admission: EM | Admit: 2019-10-06 | Discharge: 2019-10-06 | Discharge status: Against Medical Advice | Payer: Self-pay

## 2020-04-18 ENCOUNTER — Emergency Department (HOSPITAL_COMMUNITY): Payer: BC Managed Care – PPO

## 2020-04-18 ENCOUNTER — Encounter (HOSPITAL_COMMUNITY): Payer: Self-pay

## 2020-04-18 ENCOUNTER — Emergency Department (HOSPITAL_COMMUNITY)
Admission: EM | Admit: 2020-04-18 | Discharge: 2020-04-18 | Disposition: A | Discharge status: Home / Self Care | Payer: BC Managed Care – PPO | Attending: Emergency Medicine | Admitting: Emergency Medicine

## 2020-04-18 DIAGNOSIS — F17210 Nicotine dependence, cigarettes, uncomplicated: Secondary | ICD-10-CM | POA: Diagnosis not present

## 2020-04-18 DIAGNOSIS — J45909 Unspecified asthma, uncomplicated: Secondary | ICD-10-CM | POA: Diagnosis not present

## 2020-04-18 DIAGNOSIS — U071 COVID-19: Secondary | ICD-10-CM | POA: Diagnosis not present

## 2020-04-18 DIAGNOSIS — I10 Essential (primary) hypertension: Secondary | ICD-10-CM | POA: Insufficient documentation

## 2020-04-18 DIAGNOSIS — R509 Fever, unspecified: Secondary | ICD-10-CM | POA: Diagnosis present

## 2020-04-18 DIAGNOSIS — Z20822 Contact with and (suspected) exposure to covid-19: Secondary | ICD-10-CM

## 2020-04-18 LAB — RESP PANEL BY RT-PCR (FLU A&B, COVID) ARPGX2
Influenza A by PCR: NEGATIVE
Influenza B by PCR: NEGATIVE
SARS Coronavirus 2 by RT PCR: POSITIVE — AB

## 2020-04-18 MED ORDER — BENZONATATE 100 MG PO CAPS: 100.0000 mg | ORAL_CAPSULE | Freq: Three times a day (TID) | ORAL | 0 refills | Status: DC | PRN

## 2020-04-18 MED ORDER — ALBUTEROL SULFATE HFA 108 (90 BASE) MCG/ACT IN AERS
2.0000 | INHALATION_SPRAY | Freq: Once | RESPIRATORY_TRACT | Status: AC
  Administered 2020-04-18: 05:00:00 2 via RESPIRATORY_TRACT
  Filled 2020-04-18: qty 6.7

## 2020-04-18 NOTE — Discharge Instructions (Signed)
You were seen in the emergency department today with upper respiratory infection symptoms.  I suspect that she may have COVID-19 and we are testing you.  Your test results will come back in the next 1 to 2 hours.  You can follow the test results on the MyChart app.  If you test positive you would qualify for monoclonal antibodies and should discuss this with your primary care doctor.  You will be called with a positive test and they can discuss this with you as well.

## 2020-04-18 NOTE — ED Provider Notes (Signed)
Emergency Department Provider Note   I have reviewed the triage vital signs and the nursing notes.   HISTORY  Chief Complaint No chief complaint on file.   HPI Rose Gutierrez is a 39 y.o. female presents to the emergency department for evaluation of headache with low-grade fever and sneezing over the past several days.  No known sick contacts.  She denies chest pain or shortness of breath.  No abdominal pain, diarrhea, vomiting.  She feels some generalized fatigue but is overall doing okay at home.  She is eating and drinking well.  She is concerned about possible COVID-19 infection.  No radiation of symptoms or other modifying factors.  Past Medical History:  Diagnosis Date  . Anxiety   . Asthma   . Chlamydia   . Depression   . Hypertension   . Sleep apnea   . Suicidal ideation   . Trichomonas infection     Patient Active Problem List   Diagnosis Date Noted  . Schizoaffective disorder, bipolar type (HCC) 08/27/2014  . Suicidal ideation   . Morbid obesity (HCC) 04/29/2014  . HTN (hypertension) 04/29/2014  . Labial cyst 04/29/2014    Past Surgical History:  Procedure Laterality Date  . CESAREAN SECTION    . cesearean section      Allergies Patient has no known allergies.  Family History  Problem Relation Age of Onset  . Cancer Mother        breast    Social History Social History   Tobacco Use  . Smoking status: Current Every Day Smoker    Packs/day: 0.25    Years: 15.00    Pack years: 3.75    Types: Cigarettes  . Smokeless tobacco: Never Used  Substance Use Topics  . Alcohol use: Yes    Comment: occasionally   . Drug use: No    Review of Systems  Constitutional: Subjective fever and fatigue.  Eyes: No visual changes. ENT: No sore throat. Positive runny nose.  Cardiovascular: Denies chest pain.  Respiratory: Denies shortness of breath. Positive cough.  Gastrointestinal: No abdominal pain.  No nausea, no vomiting.  No diarrhea.  No  constipation. Genitourinary: Negative for dysuria. Musculoskeletal: Negative for back pain. Skin: Negative for rash. Neurological: Negative for focal weakness or numbness. Positive HA.   10-point ROS otherwise negative.  ____________________________________________   PHYSICAL EXAM:  VITAL SIGNS: ED Triage Vitals  Enc Vitals Group     BP 04/18/20 0337 (!) 168/102     Pulse Rate 04/18/20 0337 94     Resp 04/18/20 0337 16     Temp 04/18/20 0337 99.4 F (37.4 C)     Temp Source 04/18/20 0337 Oral     SpO2 04/18/20 0337 95 %     Weight 04/18/20 0337 281 lb (127.5 kg)     Height 04/18/20 0337 5' 4.5" (1.638 m)   Constitutional: Alert and oriented. Well appearing and in no acute distress. Eyes: Conjunctivae are normal.  Head: Atraumatic. Nose: No congestion/rhinnorhea. Mouth/Throat: Mucous membranes are moist.  Neck: No stridor.   Cardiovascular: Normal rate, regular rhythm. Good peripheral circulation. Grossly normal heart sounds.   Respiratory: Normal respiratory effort.  No retractions. Lungs CTAB. Gastrointestinal: Soft and nontender. No distention.  Musculoskeletal: No lower extremity tenderness nor edema. No gross deformities of extremities. Neurologic:  Normal speech and language. No gross focal neurologic deficits are appreciated.  Skin:  Skin is warm, dry and intact. No rash noted.  ____________________________________________   LABS (all labs ordered  are listed, but only abnormal results are displayed)  Labs Reviewed  RESP PANEL BY RT-PCR (FLU A&B, COVID) ARPGX2 - Abnormal; Notable for the following components:      Result Value   SARS Coronavirus 2 by RT PCR POSITIVE (*)    All other components within normal limits   ____________________________________________  RADIOLOGY  CXR reviewed.  ____________________________________________   PROCEDURES  Procedure(s) performed:   Procedures  None  ____________________________________________   INITIAL  IMPRESSION / ASSESSMENT AND PLAN / ED COURSE  Pertinent labs & imaging results that were available during my care of the patient were reviewed by me and considered in my medical decision making (see chart for details).   Patient presents to the emergency department with COVID-19-like symptoms over the past several days.  Lungs are clear with no hypoxemia or increased work of breathing on exam.  Chest x-ray with no acute findings after review.  I have sent Covid and flu PCR testing which will come back in the next several hours.  The patient will follow the test results in the MyChart app.  Medications provided at discharge for supportive care.  Advised to quarantine and follow-up with the PCP, remotely if possible. Discussed ED return precautions.   Rose Gutierrez was evaluated in Emergency Department on 04/25/2020 for the symptoms described in the history of present illness. She was evaluated in the context of the global COVID-19 pandemic, which necessitated consideration that the patient might be at risk for infection with the SARS-CoV-2 virus that causes COVID-19. Institutional protocols and algorithms that pertain to the evaluation of patients at risk for COVID-19 are in a state of rapid change based on information released by regulatory bodies including the CDC and federal and state organizations. These policies and algorithms were followed during the patient's care in the ED.    ____________________________________________  FINAL CLINICAL IMPRESSION(S) / ED DIAGNOSES  Final diagnoses:  Suspected COVID-19 virus infection     MEDICATIONS GIVEN DURING THIS VISIT:  Medications  albuterol (VENTOLIN HFA) 108 (90 Base) MCG/ACT inhaler 2 puff (2 puffs Inhalation Given 04/18/20 0441)     NEW OUTPATIENT MEDICATIONS STARTED DURING THIS VISIT:  Discharge Medication List as of 04/18/2020  6:28 AM    START taking these medications   Details  benzonatate (TESSALON) 100 MG capsule Take 1 capsule  (100 mg total) by mouth 3 (three) times daily as needed for cough., Starting Tue 04/18/2020, Print        Note:  This document was prepared using Dragon voice recognition software and may include unintentional dictation errors.  Alona Bene, MD, [facility]    [place] Markey, Arlyss Repress, MD 04/25/20 [phone]

## 2020-04-18 NOTE — ED Triage Notes (Signed)
Pt complains of a headache, low fever, and sneezing since Thursday

## 2020-04-19 ENCOUNTER — Telehealth: Payer: Self-pay | Admitting: Infectious Diseases

## 2020-04-19 ENCOUNTER — Telehealth (HOSPITAL_COMMUNITY): Payer: Self-pay

## 2020-04-19 NOTE — Telephone Encounter (Signed)
Called to Discuss with patient about Covid symptoms and the use of the monoclonal antibody infusion for those with mild to moderate Covid symptoms and at a high risk of hospitalization.     Pt appears to qualify for this infusion due to co-morbid conditions and/or a member of an at-risk group in accordance with the FDA Emergency Use Authorization.    Unable to reach pt - LVM and mychart message for her   Symptom onset: 12/23 Vaccinated: no Qualified for Infusion: Yes - BMI 47, unvaccinated, SVI score 4    Rexene Alberts, MSN, NP-C [facility]  [email or web address] Pager: [phone] Office: [phone] [facility] Main Line: [phone]

## 2020-04-19 NOTE — Telephone Encounter (Addendum)
Pt called the [facility] back requesting to be screened for the monoclonal antibodies. Pt stated her symptoms started in 12/23, tested positive at [facility] on 12/28. QRF include HTN, obesity, mental illness, and SVI.   RN informed pt that an APP will be calling to verify details and if qualifies will receive an appointment.

## 2020-04-20 ENCOUNTER — Telehealth: Payer: Self-pay | Admitting: Nurse Practitioner

## 2020-04-20 ENCOUNTER — Ambulatory Visit (HOSPITAL_COMMUNITY): Payer: 59 | Attending: Pulmonary Disease

## 2020-04-20 NOTE — Telephone Encounter (Signed)
Called to Discuss with patient about Covid symptoms and the use of the monoclonal antibody infusion for those with mild to moderate Covid symptoms and at a high risk of hospitalization.     Pt appears to qualify for this infusion due to co-morbid conditions and/or a member of an at-risk group in accordance with the FDA Emergency Use Authorization.    Patient declines treatment.

## 2020-06-02 ENCOUNTER — Other Ambulatory Visit: Payer: Self-pay

## 2020-06-02 ENCOUNTER — Ambulatory Visit (HOSPITAL_COMMUNITY)
Admission: EM | Admit: 2020-06-02 | Discharge: 2020-06-02 | Disposition: A | Discharge status: Home / Self Care | Payer: No Typology Code available for payment source | Attending: Medical Oncology | Admitting: Medical Oncology

## 2020-06-02 ENCOUNTER — Encounter (HOSPITAL_COMMUNITY): Payer: Self-pay

## 2020-06-02 DIAGNOSIS — R519 Headache, unspecified: Secondary | ICD-10-CM | POA: Diagnosis not present

## 2020-06-02 DIAGNOSIS — I10 Essential (primary) hypertension: Secondary | ICD-10-CM | POA: Diagnosis not present

## 2020-06-02 MED ORDER — AMLODIPINE BESYLATE 2.5 MG PO TABS: 2.5000 mg | ORAL_TABLET | Freq: Every day | ORAL | 0 refills | Status: DC

## 2020-06-02 MED ORDER — METHOCARBAMOL 500 MG PO TABS: 500.0000 mg | ORAL_TABLET | Freq: Two times a day (BID) | ORAL | 0 refills | Status: DC
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
## 2020-09-25 ENCOUNTER — Other Ambulatory Visit: Payer: Self-pay

## 2020-09-25 ENCOUNTER — Encounter: Payer: Self-pay | Admitting: Emergency Medicine

## 2020-09-25 ENCOUNTER — Ambulatory Visit
Admission: EM | Admit: 2020-09-25 | Discharge: 2020-09-25 | Disposition: A | Discharge status: Home / Self Care | Payer: BC Managed Care – PPO | Attending: Emergency Medicine | Admitting: Emergency Medicine

## 2020-09-25 DIAGNOSIS — S46812A Strain of other muscles, fascia and tendons at shoulder and upper arm level, left arm, initial encounter: Secondary | ICD-10-CM | POA: Diagnosis not present

## 2020-09-25 MED ORDER — TIZANIDINE HCL 2 MG PO TABS: 2.0000 mg | ORAL_TABLET | Freq: Four times a day (QID) | ORAL | 0 refills | Status: DC | PRN

## 2020-09-25 MED ORDER — IBUPROFEN 800 MG PO TABS: 800.0000 mg | ORAL_TABLET | Freq: Three times a day (TID) | ORAL | 0 refills | Status: DC

## 2020-09-25 MED ORDER — CETIRIZINE HCL 10 MG PO CAPS: 10.0000 mg | ORAL_CAPSULE | Freq: Every day | ORAL | 0 refills | Status: DC

## 2020-09-25 NOTE — Discharge Instructions (Signed)
Use anti-inflammatories for pain/swelling. You may take up to 800 mg Ibuprofen every 8 hours with food. You may supplement Ibuprofen with Tylenol [phone] mg every 8 hours.   Supplement tizanidine-may cause drowsiness, do not drive or work after taking  Alternate ice and heat, gentle stretching  Daily cetirizine/Zyrtec Claritin/Claritin help with throat irritation in the morning/postnasal drainage

## 2020-09-25 NOTE — ED Triage Notes (Signed)
Pt sts left sided neck pain into upper back worse with movement x 3 days; pt sts possible white patch on throat as well but denies sore throat

## 2020-09-25 NOTE — ED Provider Notes (Signed)
[facility]    CSN: [id number] Arrival date & time: 09/25/20  1334      History   Chief Complaint Chief Complaint  Patient presents with  . Neck Pain    HPI Rose Gutierrez is a 40 y.o. female presenting today for evaluation of neck pain.  Reports that over the past 3 days has had discomfort in her left neck into her left shoulder.  Denies injury or trauma.  Worse with movement.  Denies history of similar.  Denies numbness or tingling.  Also reports slight white area on her uvula.  Denies associated sore throat, slight irritation first thing in the morning but this is resolved.  HPI  Past Medical History:  Diagnosis Date  . Anxiety   . Asthma   . Chlamydia   . Depression   . Hypertension   . Sleep apnea   . Suicidal ideation   . Trichomonas infection     Patient Active Problem List   Diagnosis Date Noted  . Schizoaffective disorder, bipolar type (HCC) 08/27/2014  . Suicidal ideation   . Morbid obesity (HCC) 04/29/2014  . HTN (hypertension) 04/29/2014  . Labial cyst 04/29/2014    Past Surgical History:  Procedure Laterality Date  . CESAREAN SECTION    . cesearean section      OB History    Gravida  4   Para  3   Term  3   Preterm  0   AB  1   Living  3     SAB  1   IAB  0   Ectopic  0   Multiple  0   Live Births               Home Medications    Prior to Admission medications   Medication Sig Start Date End Date Taking? Authorizing Provider  Cetirizine HCl 10 MG CAPS Take 1 capsule (10 mg total) by mouth daily for 10 days. 09/25/20 10/05/20 Yes Deanglo Hissong C, PA-C  ibuprofen (ADVIL) 800 MG tablet Take 1 tablet (800 mg total) by mouth 3 (three) times daily. 09/25/20  Yes Sharelle Burditt C, PA-C  tiZANidine (ZANAFLEX) 2 MG tablet Take 1-2 tablets (2-4 mg total) by mouth every 6 (six) hours as needed for muscle spasms. 09/25/20  Yes Margy Sumler C, PA-C  amLODipine (NORVASC) 2.5 MG tablet Take 1 tablet (2.5 mg total) by  mouth daily. 06/02/20   Rushie Chestnut, PA-C  fluticasone (FLONASE) 50 MCG/ACT nasal spray Place 2 sprays into both nostrils daily. 03/23/18   Cathie Hoops, Amy V, PA-C  methocarbamol (ROBAXIN) 500 MG tablet Take 1 tablet (500 mg total) by mouth 2 (two) times daily. 06/02/20   Rushie Chestnut, PA-C  valACYclovir (VALTREX) 1000 MG tablet Take 1 tablet (1,000 mg total) by mouth 2 (two) times daily. Take for ten days. 12/04/17   Allie Bossier, MD    Family History Family History  Problem Relation Age of Onset  . Cancer Mother        breast    Social History Social History   Tobacco Use  . Smoking status: Current Every Day Smoker    Packs/day: 0.25    Years: 15.00    Pack years: 3.75    Types: Cigarettes  . Smokeless tobacco: Never Used  Substance Use Topics  . Alcohol use: Yes    Comment: occasionally   . Drug use: No     Allergies   Patient has no known allergies.  Review of Systems Review of Systems  Constitutional: Negative for fatigue and fever.  HENT: Positive for sore throat.   Eyes: Negative for visual disturbance.  Respiratory: Negative for shortness of breath.   Cardiovascular: Negative for chest pain.  Gastrointestinal: Negative for abdominal pain, nausea and vomiting.  Musculoskeletal: Positive for back pain, myalgias and neck pain. Negative for arthralgias and joint swelling.  Skin: Negative for color change, rash and wound.  Neurological: Negative for dizziness, weakness, light-headedness and headaches.     Physical Exam Triage Vital Signs ED Triage Vitals [09/25/20 1427]  Enc Vitals Group     BP (!) 177/96     Pulse Rate 92     Resp 18     Temp 98.7 F (37.1 C)     Temp Source Oral     SpO2 96 %     Weight      Height      Head Circumference      Peak Flow      Pain Score 6     Pain Loc      Pain Edu?      Excl. in GC?    No data found.  Updated Vital Signs BP (!) 177/96 (BP Location: Left Arm)   Pulse 92   Temp 98.7 F (37.1 C) (Oral)    Resp 18   SpO2 96%   Visual Acuity Right Eye Distance:   Left Eye Distance:   Bilateral Distance:    Right Eye Near:   Left Eye Near:    Bilateral Near:     Physical Exam Vitals and nursing note reviewed.  Constitutional:      Appearance: She is well-developed.     Comments: No acute distress  HENT:     Head: Normocephalic and atraumatic.     Nose: Nose normal.     Mouth/Throat:     Comments: Uvula with slight localized swelling noted to left aspect midway down, no significant tonsillar swelling erythema or exudate Eyes:     Conjunctiva/sclera: Conjunctivae normal.  Cardiovascular:     Rate and Rhythm: Normal rate.  Pulmonary:     Effort: Pulmonary effort is normal. No respiratory distress.     Comments: Breathing comfortably at rest, CTABL, no wheezing, rales or other adventitious sounds auscultated Abdominal:     General: There is no distension.  Musculoskeletal:        General: Normal range of motion.     Cervical back: Neck supple.     Comments: Nontender to palpation of cervical and thoracic spine midline, tenderness diffusely throughout left cervical musculature extending into left superior trapezius area and periscapular area  Strength at shoulders and grip strength 5/5 and equal bilaterally, radial pulse 2+ bilaterally  Skin:    General: Skin is warm and dry.  Neurological:     Mental Status: She is alert and oriented to person, place, and time.      UC Treatments / Results  Labs (all labs ordered are listed, but only abnormal results are displayed) Labs Reviewed - No data to display  EKG   Radiology No results found.  Procedures Procedures (including critical care time)  Medications Ordered in UC Medications - No data to display  Initial Impression / Assessment and Plan / UC Course  I have reviewed the triage vital signs and the nursing notes.  Pertinent labs & imaging results that were available during my care of the patient were reviewed by  me and considered in my  medical decision making (see chart for details).     1.  Cervical strain- recommending continued NSAIDs, supplement with tizanidine at home and activity modification  2.  No sign of tonsillitis or exudate, possible mucocele noted on uvula, recommended monitoring, daily antihistamine for any postnasal drainage contributing to morning irritation  Discussed strict return precautions. Patient verbalized understanding and is agreeable with plan.  Final Clinical Impressions(s) / UC Diagnoses   Final diagnoses:  Strain of left trapezius muscle, initial encounter     Discharge Instructions     Use anti-inflammatories for pain/swelling. You may take up to 800 mg Ibuprofen every 8 hours with food. You may supplement Ibuprofen with Tylenol [phone] mg every 8 hours.   Supplement tizanidine-may cause drowsiness, do not drive or work after taking  Alternate ice and heat, gentle stretching  Daily cetirizine/Zyrtec Claritin/Claritin help with throat irritation in the morning/postnasal drainage    ED Prescriptions    Medication Sig Dispense Auth. Provider   ibuprofen (ADVIL) 800 MG tablet Take 1 tablet (800 mg total) by mouth 3 (three) times daily. 21 tablet Ronin Crager C, PA-C   tiZANidine (ZANAFLEX) 2 MG tablet Take 1-2 tablets (2-4 mg total) by mouth every 6 (six) hours as needed for muscle spasms. 30 tablet Boluwatife Mutchler C, PA-C   Cetirizine HCl 10 MG CAPS Take 1 capsule (10 mg total) by mouth daily for 10 days. 10 capsule Ademola Vert, [place] C, PA-C     PDMP not reviewed this encounter.   Lew Dawes, [place] 09/25/20 1530

## 2021-03-05 ENCOUNTER — Other Ambulatory Visit: Payer: Self-pay

## 2021-03-05 ENCOUNTER — Encounter: Payer: Self-pay | Admitting: Emergency Medicine

## 2021-03-05 ENCOUNTER — Ambulatory Visit (INDEPENDENT_AMBULATORY_CARE_PROVIDER_SITE_OTHER): Payer: Self-pay

## 2021-03-05 ENCOUNTER — Ambulatory Visit
Admission: EM | Admit: 2021-03-05 | Discharge: 2021-03-05 | Disposition: A | Discharge status: Home / Self Care | Payer: Self-pay | Attending: Internal Medicine | Admitting: Internal Medicine

## 2021-03-05 DIAGNOSIS — M170 Bilateral primary osteoarthritis of knee: Secondary | ICD-10-CM

## 2021-03-05 DIAGNOSIS — M25562 Pain in left knee: Secondary | ICD-10-CM

## 2021-03-05 DIAGNOSIS — I10 Essential (primary) hypertension: Secondary | ICD-10-CM

## 2021-03-05 DIAGNOSIS — M25561 Pain in right knee: Secondary | ICD-10-CM

## 2021-03-05 MED ORDER — TIZANIDINE HCL 2 MG PO CAPS: 2.0000 mg | ORAL_CAPSULE | Freq: Three times a day (TID) | ORAL | 0 refills | Status: DC

## 2021-03-05 MED ORDER — DICLOFENAC SODIUM 1 % EX GEL: 4.0000 g | Freq: Four times a day (QID) | CUTANEOUS | 0 refills | Status: DC

## 2021-03-05 NOTE — ED Provider Notes (Signed)
[facility]    CSN: [id number] Arrival date & time: 03/05/21  1126      History   Chief Complaint Chief Complaint  Patient presents with   Knee Pain    HPI Rose Gutierrez is a 40 y.o. female presenting with bilateral knee pain for 2 weeks.  Medical history morbid obesity.  Patient describes her first left then bilateral knee pain.  Denies trauma but does endorse overuse because she has to stand all day at work.  Pain is worse after standing all day or after going from sitting to standing after periods of rest.  Has tried various over-the-counter medications including IcyHot, heating pad with minimal relief.  She noticed that elevating her legs actually helped the most.  Denies history of knee problems in the past.  HPI  Past Medical History:  Diagnosis Date   Anxiety    Asthma    Chlamydia    Depression    Hypertension    Sleep apnea    Suicidal ideation    Trichomonas infection     Patient Active Problem List   Diagnosis Date Noted   Schizoaffective disorder, bipolar type (HCC) 08/27/2014   Suicidal ideation    Morbid obesity (HCC) 04/29/2014   HTN (hypertension) 04/29/2014   Labial cyst 04/29/2014    Past Surgical History:  Procedure Laterality Date   CESAREAN SECTION     cesearean section      OB History     Gravida  4   Para  3   Term  3   Preterm  0   AB  1   Living  3      SAB  1   IAB  0   Ectopic  0   Multiple  0   Live Births               Home Medications    Prior to Admission medications   Medication Sig Start Date End Date Taking? Authorizing Provider  amLODipine (NORVASC) 2.5 MG tablet Take 1 tablet (2.5 mg total) by mouth daily. 06/02/20   Rushie Chestnut, PA-C  Cetirizine HCl 10 MG CAPS Take 1 capsule (10 mg total) by mouth daily for 10 days. 09/25/20 10/05/20  Wieters, Hallie C, PA-C  diclofenac Sodium (VOLTAREN) 1 % GEL Apply 4 g topically 4 (four) times daily. 03/05/21   Rhys Martini, PA-C   fluticasone (FLONASE) 50 MCG/ACT nasal spray Place 2 sprays into both nostrils daily. 03/23/18   Cathie Hoops, Amy V, PA-C  ibuprofen (ADVIL) 800 MG tablet Take 1 tablet (800 mg total) by mouth 3 (three) times daily. 09/25/20   Wieters, Hallie C, PA-C  tizanidine (ZANAFLEX) 2 MG capsule Take 1 capsule (2 mg total) by mouth 3 (three) times daily. 03/05/21   Rhys Martini, PA-C  valACYclovir (VALTREX) 1000 MG tablet Take 1 tablet (1,000 mg total) by mouth 2 (two) times daily. Take for ten days. 12/04/17   Allie Bossier, MD    Family History Family History  Problem Relation Age of Onset   Cancer Mother        breast    Social History Social History   Tobacco Use   Smoking status: Every Day    Packs/day: 0.25    Years: 15.00    Pack years: 3.75    Types: Cigarettes   Smokeless tobacco: Never  Substance Use Topics   Alcohol use: Yes    Comment: occasionally    Drug use:  No     Allergies   Patient has no known allergies.   Review of Systems Review of Systems  Musculoskeletal:        Knee pain    Physical Exam Triage Vital Signs ED Triage Vitals [03/05/21 1215]  Enc Vitals Group     BP (!) 172/103     Pulse Rate 81     Resp 16     Temp 97.9 F (36.6 C)     Temp Source Oral     SpO2 99 %     Weight      Height      Head Circumference      Peak Flow      Pain Score 3     Pain Loc      Pain Edu?      Excl. in GC?    No data found.  Updated Vital Signs BP (!) 172/103 (BP Location: Left Arm)   Pulse 81   Temp 97.9 F (36.6 C) (Oral)   Resp 16   SpO2 99%   Visual Acuity Right Eye Distance:   Left Eye Distance:   Bilateral Distance:    Right Eye Near:   Left Eye Near:    Bilateral Near:     Physical Exam Vitals reviewed.  Constitutional:      General: She is not in acute distress.    Appearance: Normal appearance. She is obese. She is not ill-appearing or diaphoretic.  HENT:     Head: Normocephalic and atraumatic.  Cardiovascular:     Rate and Rhythm:  Normal rate and regular rhythm.     Heart sounds: Normal heart sounds.  Pulmonary:     Effort: Pulmonary effort is normal.     Breath sounds: Normal breath sounds.  Musculoskeletal:     Comments: Bilateral knees: symmetric without skin changes. No calf or thigh distension or tenderness. No point tenderness. Both knees are stiff with extension, with mild crepitus. No joint laxity. Ambulating with pain.  Skin:    General: Skin is warm.  Neurological:     General: No focal deficit present.     Mental Status: She is alert and oriented to person, place, and time.  Psychiatric:        Mood and Affect: Mood normal.        Behavior: Behavior normal.        Thought Content: Thought content normal.        Judgment: Judgment normal.     UC Treatments / Results  Labs (all labs ordered are listed, but only abnormal results are displayed) Labs Reviewed - No data to display  EKG   Radiology DG Knee Complete 4 Views Left  Result Date: 03/05/2021 CLINICAL DATA:  Bilateral knee pain for 2 weeks. No reported injury. EXAM: LEFT KNEE - COMPLETE 4+ VIEW COMPARISON:  None. FINDINGS: No fracture, significant joint effusion or dislocation. No focal osseous lesions. Minimal medial compartment osteoarthritis. Preserved lateral and patellofemoral compartments. No radiopaque foreign bodies. IMPRESSION: Minimal medial compartment left knee osteoarthritis. No acute osseous abnormality. Electronically Signed   By: Delbert Phenix M.D.   On: 03/05/2021 13:00   DG Knee Complete 4 Views Right  Result Date: 03/05/2021 CLINICAL DATA:  Bilateral knee pain, no reported injury EXAM: RIGHT KNEE - COMPLETE 4+ VIEW COMPARISON:  None. FINDINGS: Small suprapatellar right knee joint effusion. No fracture or dislocation. No focal osseous lesions. Small to moderate enthesophyte at the inferior patella. No significant degenerative arthropathy. No  radiopaque foreign bodies. IMPRESSION: 1. Small suprapatellar right knee joint  effusion. No acute osseous abnormality. 2. Small to moderate enthesophyte at the inferior patella. Electronically Signed   By: Delbert Phenix M.D.   On: 03/05/2021 12:57    Procedures Procedures (including critical care time)  Medications Ordered in UC Medications - No data to display  Initial Impression / Assessment and Plan / UC Course  I have reviewed the triage vital signs and the nursing notes.  Pertinent labs & imaging results that were available during my care of the patient were reviewed by me and considered in my medical decision making (see chart for details).     This patient is a very pleasant 40 y.o. year old female presenting with bilateral knee OA. No red flag symptoms.   Xray L knee - Minimal medial compartment left knee osteoarthritis. No acute osseous abnormality.  Xray R knee - 1. Small suprapatellar right knee joint effusion. No acute osseous abnormality. 2. Small to moderate enthesophyte at the inferior patella.  Pick up OTC knee braces. Sent voltaren and zanaflex. F/u with ortho.  ED return precautions discussed. Patient verbalizes understanding and agreement.    Final Clinical Impressions(s) / UC Diagnoses   Final diagnoses:  Primary osteoarthritis of both knees  Essential hypertension     Discharge Instructions      -Start the muscle relaxer-Zanaflex (tizanidine), up to 3 times daily for muscle spasms and pain.  This can make you drowsy, so take at bedtime or when you do not need to drive or operate machinery. -Voltaren gel up to 4x daily for pain -You can take Tylenol up to 1000 mg 3 times daily, and ibuprofen up to 600 mg 3 times daily with food.  You can take these together, or alternate every 3-4 hours. -Knee braces while pain persists -Rest, elevation -If symptoms persist in 5-7 days, follow-up with an orthopedist. I recommend [facility] at [address] [place], [place] [address]. You can schedule an appointment by calling [phone]) or  online ([email or web address]), but they also have a walk-in clinic M-F 8a-8p and Sat 10a-3p. -Please check your blood pressure at home or at the pharmacy. If this continues to be >140/90, follow-up with your primary care provider for further blood pressure management/ medication titration. If you develop chest pain, shortness of breath, vision changes, the worst headache of your life- head straight to the ED or call 911.      ED Prescriptions     Medication Sig Dispense Auth. Provider   tizanidine (ZANAFLEX) 2 MG capsule  (Status: Discontinued) Take 1 capsule (2 mg total) by mouth 3 (three) times daily. 21 capsule Ignacia Bayley E, PA-C   diclofenac Sodium (VOLTAREN) 1 % GEL  (Status: Discontinued) Apply 4 g topically 4 (four) times daily. 100 g Rhys Martini, PA-C   diclofenac Sodium (VOLTAREN) 1 % GEL Apply 4 g topically 4 (four) times daily. 100 g Rhys Martini, PA-C   tizanidine (ZANAFLEX) 2 MG capsule Take 1 capsule (2 mg total) by mouth 3 (three) times daily. 21 capsule Rhys Martini, PA-C      PDMP not reviewed this encounter.   Rhys Martini, PA-C 03/05/21 1354

## 2021-03-05 NOTE — Discharge Instructions (Signed)
-  Start the muscle relaxer-Zanaflex (tizanidine), up to 3 times daily for muscle spasms and pain.  This can make you drowsy, so take at bedtime or when you do not need to drive or operate machinery. -Voltaren gel up to 4x daily for pain -You can take Tylenol up to 1000 mg 3 times daily, and ibuprofen up to 600 mg 3 times daily with food.  You can take these together, or alternate every 3-4 hours. -Knee braces while pain persists -Rest, elevation -If symptoms persist in 5-7 days, follow-up with an orthopedist. I recommend [facility] at [address], [place], [place] [address]. You can schedule an appointment by calling [phone]) or online ([email or web address]), but they also have a walk-in clinic M-F 8a-8p and Sat 10a-3p. -Please check your blood pressure at home or at the pharmacy. If this continues to be >140/90, follow-up with your primary care provider for further blood pressure management/ medication titration. If you develop chest pain, shortness of breath, vision changes, the worst headache of your life- head straight to the ED or call 911.

## 2021-03-05 NOTE — ED Triage Notes (Addendum)
Pressure in left knee starting 2 weeks prior, then moved to right knee shortly after. Pain and pressure. Requiring help to walk. Denies any injury to either area, previous hx of injury to area. Patient is obese, has to stand a lot while at job. Movement makes pain worse, resting makes it slightly better. Has tried icyhot, heating pads without relief. States that elevating her legs was what has helped to relieve the pain the most.

## 2023-05-24 DIAGNOSIS — Z419 Encounter for procedure for purposes other than remedying health state, unspecified: Secondary | ICD-10-CM | POA: Diagnosis not present

## 2023-06-04 ENCOUNTER — Emergency Department (HOSPITAL_COMMUNITY): Payer: Self-pay

## 2023-06-04 ENCOUNTER — Inpatient Hospital Stay (HOSPITAL_COMMUNITY)
Admission: EM | Admit: 2023-06-04 | Discharge: 2023-06-06 | DRG: 871 | Disposition: A | Discharge status: Home / Self Care | Payer: Medicaid Other | Attending: Internal Medicine | Admitting: Internal Medicine

## 2023-06-04 ENCOUNTER — Other Ambulatory Visit: Payer: Self-pay

## 2023-06-04 ENCOUNTER — Encounter (HOSPITAL_COMMUNITY): Payer: Self-pay

## 2023-06-04 DIAGNOSIS — J45901 Unspecified asthma with (acute) exacerbation: Secondary | ICD-10-CM | POA: Diagnosis present

## 2023-06-04 DIAGNOSIS — Z87891 Personal history of nicotine dependence: Secondary | ICD-10-CM | POA: Insufficient documentation

## 2023-06-04 DIAGNOSIS — R0602 Shortness of breath: Principal | ICD-10-CM

## 2023-06-04 DIAGNOSIS — F17210 Nicotine dependence, cigarettes, uncomplicated: Secondary | ICD-10-CM | POA: Diagnosis present

## 2023-06-04 DIAGNOSIS — R079 Chest pain, unspecified: Secondary | ICD-10-CM | POA: Diagnosis not present

## 2023-06-04 DIAGNOSIS — I10 Essential (primary) hypertension: Secondary | ICD-10-CM | POA: Diagnosis present

## 2023-06-04 DIAGNOSIS — Z79899 Other long term (current) drug therapy: Secondary | ICD-10-CM

## 2023-06-04 DIAGNOSIS — J189 Pneumonia, unspecified organism: Secondary | ICD-10-CM | POA: Diagnosis present

## 2023-06-04 DIAGNOSIS — A419 Sepsis, unspecified organism: Principal | ICD-10-CM | POA: Diagnosis present

## 2023-06-04 DIAGNOSIS — G4730 Sleep apnea, unspecified: Secondary | ICD-10-CM | POA: Diagnosis present

## 2023-06-04 DIAGNOSIS — Z720 Tobacco use: Secondary | ICD-10-CM | POA: Insufficient documentation

## 2023-06-04 DIAGNOSIS — Z6841 Body Mass Index (BMI) 40.0 and over, adult: Secondary | ICD-10-CM

## 2023-06-04 DIAGNOSIS — J9601 Acute respiratory failure with hypoxia: Secondary | ICD-10-CM | POA: Diagnosis present

## 2023-06-04 DIAGNOSIS — R911 Solitary pulmonary nodule: Secondary | ICD-10-CM | POA: Diagnosis present

## 2023-06-04 DIAGNOSIS — R069 Unspecified abnormalities of breathing: Secondary | ICD-10-CM | POA: Diagnosis not present

## 2023-06-04 DIAGNOSIS — R0689 Other abnormalities of breathing: Secondary | ICD-10-CM | POA: Diagnosis not present

## 2023-06-04 DIAGNOSIS — R062 Wheezing: Secondary | ICD-10-CM | POA: Diagnosis not present

## 2023-06-04 DIAGNOSIS — Z803 Family history of malignant neoplasm of breast: Secondary | ICD-10-CM

## 2023-06-04 DIAGNOSIS — Z1152 Encounter for screening for COVID-19: Secondary | ICD-10-CM

## 2023-06-04 DIAGNOSIS — Z716 Tobacco abuse counseling: Secondary | ICD-10-CM

## 2023-06-04 MED ORDER — MAGNESIUM SULFATE IN D5W 1-5 GM/100ML-% IV SOLN
1.0000 g | Freq: Once | INTRAVENOUS | Status: AC
  Administered 2023-06-05: 1 g via INTRAVENOUS
  Filled 2023-06-04: qty 100

## 2023-06-04 MED ORDER — ALBUTEROL SULFATE HFA 108 (90 BASE) MCG/ACT IN AERS: 2.0000 | INHALATION_SPRAY | RESPIRATORY_TRACT | Status: DC | PRN

## 2023-06-04 MED ORDER — MAGNESIUM SULFATE 50 % IJ SOLN: 1.0000 g | Freq: Once | INTRAMUSCULAR | Status: DC

## 2023-06-04 MED ORDER — IPRATROPIUM-ALBUTEROL 0.5-2.5 (3) MG/3ML IN SOLN
3.0000 mL | Freq: Once | RESPIRATORY_TRACT | Status: AC
  Administered 2023-06-05: 3 mL via RESPIRATORY_TRACT
  Filled 2023-06-04: qty 3

## 2023-06-04 NOTE — ED Triage Notes (Signed)
Complaining of shortness of breath that started yesterday. Went to work today and the wheezing got worse. EMS gave a total of 3 breathing treatments, 2 duo nebs and one albuterol. Gave 125mg  of solumedrol as well.

## 2023-06-04 NOTE — ED Provider Notes (Signed)
[place] EMERGENCY DEPARTMENT AT [facility] Provider Note   CSN: [id number] Arrival date & time: 06/04/23  2243     History {Add pertinent medical, surgical, social history, OB history to HPI:1} Chief Complaint  Patient presents with   Shortness of Breath    Rose Gutierrez is a 43 y.o. female.  Patient presents to the Emergency Department via EMS complaining of shortness of breath which began yesterday.  Patient reports that she went to work today and had significant increase in wheezing.  When EMS arrived they report the patient was moving little air, and administered 2 DuoNebs and 1 albuterol treatment.  They also administered 125 mg of Solu-Medrol.  Work of breathing seem to ease.  Patient states that she may have had a subjective fever and endorses sick contacts.  Past medical history significant for hypertension, morbid obesity, asthma (in chart but patient denies same)   Shortness of Breath      Home Medications Prior to Admission medications   Medication Sig Start Date End Date Taking? Authorizing Provider  amLODipine (NORVASC) 2.5 MG tablet Take 1 tablet (2.5 mg total) by mouth daily. 06/02/20   Rushie Chestnut, PA-C  Cetirizine HCl 10 MG CAPS Take 1 capsule (10 mg total) by mouth daily for 10 days. 09/25/20 10/05/20  Wieters, Hallie C, PA-C  diclofenac Sodium (VOLTAREN) 1 % GEL Apply 4 g topically 4 (four) times daily. 03/05/21   Rhys Martini, PA-C  fluticasone (FLONASE) 50 MCG/ACT nasal spray Place 2 sprays into both nostrils daily. 03/23/18   Cathie Hoops, Amy V, PA-C  ibuprofen (ADVIL) 800 MG tablet Take 1 tablet (800 mg total) by mouth 3 (three) times daily. 09/25/20   Wieters, Hallie C, PA-C  tizanidine (ZANAFLEX) 2 MG capsule Take 1 capsule (2 mg total) by mouth 3 (three) times daily. 03/05/21   Rhys Martini, PA-C  valACYclovir (VALTREX) 1000 MG tablet Take 1 tablet (1,000 mg total) by mouth 2 (two) times daily. Take for ten days. 12/04/17   Allie Bossier, MD       Allergies    Patient has no known allergies.    Review of Systems   Review of Systems  Respiratory:  Positive for shortness of breath.     Physical Exam Updated Vital Signs BP (!) 142/107   Pulse (!) 114   Resp (!) 41   Ht 5\' 4"  (1.626 m)   Wt 127.5 kg   LMP 05/21/2023   SpO2 100%   BMI 48.23 kg/m  Physical Exam Vitals and nursing note reviewed.  Constitutional:      General: She is not in acute distress.    Appearance: She is well-developed.  HENT:     Head: Normocephalic and atraumatic.  Eyes:     Conjunctiva/sclera: Conjunctivae normal.  Cardiovascular:     Rate and Rhythm: Regular rhythm. Tachycardia present.  Pulmonary:     Effort: Pulmonary effort is normal. Tachypnea present. No respiratory distress.     Breath sounds: Wheezing present. No rhonchi or rales.  Chest:     Chest wall: No tenderness.  Abdominal:     Palpations: Abdomen is soft.     Tenderness: There is no abdominal tenderness.  Musculoskeletal:        General: No swelling.     Cervical back: Neck supple.     Right lower leg: No edema.     Left lower leg: No edema.  Skin:    General: Skin is warm and dry.  Capillary Refill: Capillary refill takes less than 2 seconds.  Neurological:     Mental Status: She is alert.  Psychiatric:        Mood and Affect: Mood normal.     ED Results / Procedures / Treatments   Labs (all labs ordered are listed, but only abnormal results are displayed) Labs Reviewed  RESP PANEL BY RT-PCR (RSV, FLU A&B, COVID)  [id number]  BASIC METABOLIC PANEL  BRAIN NATRIURETIC PEPTIDE  CBC WITH DIFFERENTIAL/PLATELET    EKG EKG Interpretation Date/Time:  Wednesday June 04 2023 23:00:17 EST Ventricular Rate:  116 PR Interval:  163 QRS Duration:  78 QT Interval:  326 QTC Calculation: 453 R Axis:   44  Text Interpretation: Sinus tachycardia Borderline repolarization abnormality Confirmed by Tilden Fossa [phone]) on 06/04/2023 11:19:25 PM  Radiology DG  Chest Port 1 View Result Date: 06/04/2023 CLINICAL DATA:  Wheezing and shortness of breath. EXAM: PORTABLE CHEST 1 VIEW COMPARISON:  None Available. FINDINGS: The heart size and mediastinal contours are within normal limits. Low lung volumes are noted. There is no evidence of an acute infiltrate, pleural effusion or pneumothorax. The visualized skeletal structures are unremarkable. IMPRESSION: Low lung volumes without evidence of acute or active cardiopulmonary disease. Electronically Signed   By: Aram Candela M.D.   On: 06/04/2023 23:16    Procedures Procedures  {Document cardiac monitor, telemetry assessment procedure when appropriate:1}  Medications Ordered in ED Medications  albuterol (VENTOLIN HFA) 108 (90 Base) MCG/ACT inhaler 2 puff (has no administration in time range)  ipratropium-albuterol (DUONEB) 0.5-2.5 (3) MG/3ML nebulizer solution 3 mL (has no administration in time range)  magnesium sulfate IVPB 1 g 100 mL (has no administration in time range)    ED Course/ Medical Decision Making/ A&P   {   Click here for ABCD2, HEART and other calculatorsREFRESH Note before signing :1}                              Medical Decision Making Amount and/or Complexity of Data Reviewed Labs: ordered. Radiology: ordered.  Risk Prescription drug management.   This patient presents to the ED for concern of shortness of breath, this involves an extensive number of treatment options, and is a complaint that carries with it a high risk of complications and morbidity.  The differential diagnosis includes ***   Co morbidities that complicate the patient evaluation  ***   Additional history obtained:  Additional history obtained from *** External records from outside source obtained and reviewed including ***   Lab Tests:  I Ordered, and personally interpreted labs.  The pertinent results include:  ***   Imaging Studies ordered:  I ordered imaging studies including ***  I  independently visualized and interpreted imaging which showed *** I agree with the radiologist interpretation   Cardiac Monitoring: / EKG:  The patient was maintained on a cardiac monitor.  I personally viewed and interpreted the cardiac monitored which showed an underlying rhythm of: ***   Consultations Obtained:  I requested consultation with the ***,  and discussed lab and imaging findings as well as pertinent plan - they recommend: ***   Problem List / ED Course / Critical interventions / Medication management  *** I ordered medication including ***  for ***  Reevaluation of the patient after these medicines showed that the patient {resolved/improved/worsened:23923::"improved"} I have reviewed the patients home medicines and have made adjustments as needed   Social Determinants of  Health:  ***   Test / Admission - Considered:  ***   {Document critical care time when appropriate:1} {Document review of labs and clinical decision tools ie heart score, Chads2Vasc2 etc:1}  {Document your independent review of radiology images, and any outside records:1} {Document your discussion with family members, caretakers, and with consultants:1} {Document social determinants of health affecting pt's care:1} {Document your decision making why or why not admission, treatments were needed:1} Final Clinical Impression(s) / ED Diagnoses Final diagnoses:  None    Rx / DC Orders ED Discharge Orders     None

## 2023-06-05 ENCOUNTER — Encounter (HOSPITAL_COMMUNITY): Payer: Self-pay

## 2023-06-05 ENCOUNTER — Emergency Department (HOSPITAL_COMMUNITY): Payer: Self-pay

## 2023-06-05 ENCOUNTER — Other Ambulatory Visit: Payer: Self-pay

## 2023-06-05 DIAGNOSIS — Z803 Family history of malignant neoplasm of breast: Secondary | ICD-10-CM | POA: Diagnosis not present

## 2023-06-05 DIAGNOSIS — Z1152 Encounter for screening for COVID-19: Secondary | ICD-10-CM | POA: Diagnosis not present

## 2023-06-05 DIAGNOSIS — J9601 Acute respiratory failure with hypoxia: Secondary | ICD-10-CM | POA: Diagnosis present

## 2023-06-05 DIAGNOSIS — A419 Sepsis, unspecified organism: Secondary | ICD-10-CM | POA: Diagnosis present

## 2023-06-05 DIAGNOSIS — I10 Essential (primary) hypertension: Secondary | ICD-10-CM | POA: Diagnosis present

## 2023-06-05 DIAGNOSIS — F17210 Nicotine dependence, cigarettes, uncomplicated: Secondary | ICD-10-CM | POA: Diagnosis present

## 2023-06-05 DIAGNOSIS — R911 Solitary pulmonary nodule: Secondary | ICD-10-CM | POA: Diagnosis not present

## 2023-06-05 DIAGNOSIS — G4730 Sleep apnea, unspecified: Secondary | ICD-10-CM | POA: Diagnosis present

## 2023-06-05 DIAGNOSIS — J189 Pneumonia, unspecified organism: Secondary | ICD-10-CM

## 2023-06-05 DIAGNOSIS — Z79899 Other long term (current) drug therapy: Secondary | ICD-10-CM | POA: Diagnosis not present

## 2023-06-05 DIAGNOSIS — J45901 Unspecified asthma with (acute) exacerbation: Secondary | ICD-10-CM | POA: Diagnosis present

## 2023-06-05 DIAGNOSIS — R0602 Shortness of breath: Secondary | ICD-10-CM | POA: Diagnosis present

## 2023-06-05 DIAGNOSIS — Z6841 Body Mass Index (BMI) 40.0 and over, adult: Secondary | ICD-10-CM | POA: Diagnosis not present

## 2023-06-05 DIAGNOSIS — Z716 Tobacco abuse counseling: Secondary | ICD-10-CM | POA: Diagnosis not present

## 2023-06-05 DIAGNOSIS — J4521 Mild intermittent asthma with (acute) exacerbation: Secondary | ICD-10-CM | POA: Diagnosis not present

## 2023-06-05 LAB — CBC WITH DIFFERENTIAL/PLATELET
Abs Immature Granulocytes: 0.07 10*3/uL (ref 0.00–0.07)
Basophils Absolute: 0.1 10*3/uL (ref 0.0–0.1)
Basophils Relative: 0 %
Eosinophils Absolute: 0.1 10*3/uL (ref 0.0–0.5)
Eosinophils Relative: 1 %
HCT: 36.4 % (ref 36.0–46.0)
Hemoglobin: 10.9 g/dL — ABNORMAL LOW (ref 12.0–15.0)
Immature Granulocytes: 1 %
Lymphocytes Relative: 6 %
Lymphs Abs: 0.9 10*3/uL (ref 0.7–4.0)
MCH: 26.8 pg (ref 26.0–34.0)
MCHC: 29.9 g/dL — ABNORMAL LOW (ref 30.0–36.0)
MCV: 89.4 fL (ref 80.0–100.0)
Monocytes Absolute: 0.6 10*3/uL (ref 0.1–1.0)
Monocytes Relative: 4 %
Neutro Abs: 13.3 10*3/uL — ABNORMAL HIGH (ref 1.7–7.7)
Neutrophils Relative %: 88 %
Platelets: 397 10*3/uL (ref 150–400)
RBC: 4.07 MIL/uL (ref 3.87–5.11)
RDW: 14.2 % (ref 11.5–15.5)
WBC: 15.1 10*3/uL — ABNORMAL HIGH (ref 4.0–10.5)
nRBC: 0 % (ref 0.0–0.2)

## 2023-06-05 LAB — RESP PANEL BY RT-PCR (RSV, FLU A&B, COVID)  RVPGX2
Influenza A by PCR: NEGATIVE
Influenza B by PCR: NEGATIVE
Resp Syncytial Virus by PCR: NEGATIVE
SARS Coronavirus 2 by RT PCR: NEGATIVE

## 2023-06-05 LAB — BASIC METABOLIC PANEL
Anion gap: 9 (ref 5–15)
BUN: 10 mg/dL (ref 6–20)
CO2: 27 mmol/L (ref 22–32)
Calcium: 8.9 mg/dL (ref 8.9–10.3)
Chloride: 102 mmol/L (ref 98–111)
Creatinine, Ser: 0.67 mg/dL (ref 0.44–1.00)
GFR, Estimated: >60 mL/min (ref >60)
Glucose, Bld: 123 mg/dL — ABNORMAL HIGH (ref 70–99)
Potassium: 3.2 mmol/L — ABNORMAL LOW (ref 3.5–5.1)
Sodium: 138 mmol/L (ref 135–145)

## 2023-06-05 LAB — BRAIN NATRIURETIC PEPTIDE: B Natriuretic Peptide: 66.9 pg/mL (ref 0.0–100.0)

## 2023-06-05 LAB — HCG, SERUM, QUALITATIVE: Preg, Serum: NEGATIVE

## 2023-06-05 LAB — HIV ANTIBODY (ROUTINE TESTING W REFLEX): HIV Screen 4th Generation wRfx: NONREACTIVE

## 2023-06-05 LAB — LACTIC ACID, PLASMA
Lactic Acid, Venous: 2.4 mmol/L (ref 0.5–1.9)
Lactic Acid, Venous: 1.2 mmol/L (ref 0.5–1.9)

## 2023-06-05 MED ORDER — HYDRALAZINE HCL 20 MG/ML IJ SOLN
5.0000 mg | Freq: Four times a day (QID) | INTRAMUSCULAR | Status: DC | PRN
  Administered 2023-06-05: 5 mg via INTRAVENOUS
  Filled 2023-06-05: qty 1

## 2023-06-05 MED ORDER — POTASSIUM CHLORIDE CRYS ER 20 MEQ PO TBCR
40.0000 meq | EXTENDED_RELEASE_TABLET | Freq: Once | ORAL | Status: AC
  Administered 2023-06-05: 40 meq via ORAL
  Filled 2023-06-05: qty 2

## 2023-06-05 MED ORDER — KETOROLAC TROMETHAMINE 15 MG/ML IJ SOLN
30.0000 mg | Freq: Once | INTRAMUSCULAR | Status: AC
  Administered 2023-06-05: 30 mg via INTRAVENOUS
  Filled 2023-06-05: qty 2

## 2023-06-05 MED ORDER — AZITHROMYCIN 500 MG IV SOLR
500.0000 mg | INTRAVENOUS | Status: DC
  Administered 2023-06-06: 500 mg via INTRAVENOUS
  Filled 2023-06-05: qty 5

## 2023-06-05 MED ORDER — AMLODIPINE BESYLATE 10 MG PO TABS
10.0000 mg | ORAL_TABLET | Freq: Every day | ORAL | Status: DC
  Administered 2023-06-05 – 2023-06-06 (×2): 10 mg via ORAL
  Filled 2023-06-05: qty 2
  Filled 2023-06-05: qty 1

## 2023-06-05 MED ORDER — TRAZODONE HCL 50 MG PO TABS: 25.0000 mg | ORAL_TABLET | Freq: Every evening | ORAL | Status: DC | PRN

## 2023-06-05 MED ORDER — ACETAMINOPHEN 650 MG RE SUPP: 650.0000 mg | Freq: Four times a day (QID) | RECTAL | Status: DC | PRN

## 2023-06-05 MED ORDER — ENOXAPARIN SODIUM 60 MG/0.6ML IJ SOSY
60.0000 mg | PREFILLED_SYRINGE | INTRAMUSCULAR | Status: DC
  Administered 2023-06-05 – 2023-06-06 (×2): 60 mg via SUBCUTANEOUS
  Filled 2023-06-05 (×2): qty 0.6

## 2023-06-05 MED ORDER — SODIUM CHLORIDE 0.9 % IV BOLUS
1000.0000 mL | Freq: Once | INTRAVENOUS | Status: AC
  Administered 2023-06-05: 1000 mL via INTRAVENOUS

## 2023-06-05 MED ORDER — PROCHLORPERAZINE EDISYLATE 10 MG/2ML IJ SOLN
5.0000 mg | Freq: Once | INTRAMUSCULAR | Status: AC
  Administered 2023-06-05: 5 mg via INTRAVENOUS
  Filled 2023-06-05: qty 2

## 2023-06-05 MED ORDER — SODIUM CHLORIDE 0.9 % IV SOLN
1.0000 g | INTRAVENOUS | Status: DC
  Administered 2023-06-06: 1 g via INTRAVENOUS
  Filled 2023-06-05: qty 10

## 2023-06-05 MED ORDER — HYDRALAZINE HCL 20 MG/ML IJ SOLN
10.0000 mg | Freq: Once | INTRAMUSCULAR | Status: AC
  Administered 2023-06-05: 10 mg via INTRAVENOUS
  Filled 2023-06-05: qty 1

## 2023-06-05 MED ORDER — ACETAMINOPHEN 325 MG PO TABS
650.0000 mg | ORAL_TABLET | Freq: Four times a day (QID) | ORAL | Status: DC | PRN
  Administered 2023-06-05 – 2023-06-06 (×3): 650 mg via ORAL
  Filled 2023-06-05 (×4): qty 2

## 2023-06-05 MED ORDER — ONDANSETRON HCL 4 MG/2ML IJ SOLN: 4.0000 mg | Freq: Four times a day (QID) | INTRAMUSCULAR | Status: DC | PRN

## 2023-06-05 MED ORDER — METHYLPREDNISOLONE SODIUM SUCC 40 MG IJ SOLR
40.0000 mg | Freq: Two times a day (BID) | INTRAMUSCULAR | Status: DC
  Administered 2023-06-05 – 2023-06-06 (×3): 40 mg via INTRAVENOUS
  Filled 2023-06-05 (×4): qty 1

## 2023-06-05 MED ORDER — IPRATROPIUM-ALBUTEROL 0.5-2.5 (3) MG/3ML IN SOLN
3.0000 mL | Freq: Four times a day (QID) | RESPIRATORY_TRACT | Status: DC
  Administered 2023-06-05 – 2023-06-06 (×5): 3 mL via RESPIRATORY_TRACT
  Filled 2023-06-05 (×5): qty 3

## 2023-06-05 MED ORDER — IOHEXOL 350 MG/ML SOLN
100.0000 mL | Freq: Once | INTRAVENOUS | Status: AC | PRN
Start: 2023-06-05 — End: 2023-06-05
  Administered 2023-06-05: 100 mL via INTRAVENOUS

## 2023-06-05 MED ORDER — ONDANSETRON HCL 4 MG PO TABS: 4.0000 mg | ORAL_TABLET | Freq: Four times a day (QID) | ORAL | Status: DC | PRN

## 2023-06-05 MED ORDER — ALBUTEROL SULFATE (2.5 MG/3ML) 0.083% IN NEBU: 2.5000 mg | INHALATION_SOLUTION | RESPIRATORY_TRACT | Status: DC | PRN

## 2023-06-05 MED ORDER — SODIUM CHLORIDE 0.9 % IV SOLN
500.0000 mg | Freq: Once | INTRAVENOUS | Status: AC
  Administered 2023-06-05: 500 mg via INTRAVENOUS
  Filled 2023-06-05: qty 5

## 2023-06-05 MED ORDER — SODIUM CHLORIDE 0.9 % IV SOLN
1.0000 g | Freq: Once | INTRAVENOUS | Status: AC
  Administered 2023-06-05: 1 g via INTRAVENOUS
  Filled 2023-06-05: qty 10

## 2023-06-05 MED ORDER — SODIUM CHLORIDE (PF) 0.9 % IJ SOLN
INTRAMUSCULAR | Status: AC
  Filled 2023-06-05: qty 50

## 2023-06-05 NOTE — ED Notes (Signed)
Hospitalist at bedside

## 2023-06-05 NOTE — ED Notes (Signed)
Patient up to bedside commode without any issues

## 2023-06-05 NOTE — ED Notes (Signed)
Meal tray delivered patient states I don't eat pork given [place] sandwich

## 2023-06-05 NOTE — ED Notes (Signed)
Report given to Delice Bison, Charity fundraiser.

## 2023-06-05 NOTE — H&P (Addendum)
History and Physical  Rose Gutierrez WUJ:[id number] DOB: 18-Sep-1980 DOA: 06/04/2023  PCP: Patient, No Pcp Per   Chief Complaint: Cough, shortness of breath  HPI: Rose Gutierrez is a 43 y.o. female with medical history significant for obesity, hypertension and asthma being admitted to the hospital with sepsis due to community-acquired pneumonia and asthma exacerbation.  States that she showed having some nonproductive cough and chest tightness a couple of days ago.  A couple of her friends have been sick with similar symptoms.  Yesterday, her symptoms were progressive with worsening dyspnea with exertion.  She denies any chest pain, or pleurisy.  Had some subjective fever but did not measure.  Denies any nausea, vomiting, diarrhea, or other complaints.  Shortness of breath was progressive to the point that she decided to come to the ER for evaluation.  She got to DuoNebs and 1 albuterol treatment with EMS, as well as 125 mg of Solu-Medrol.  In the emergency department, she was given IV magnesium and DuoNeb treatment.  She had CT scan as detailed below, was started on empiric IV azithromycin and IV Rocephin.  Due to hypoxia, she was placed on 4 L nasal cannula oxygen, this has now been weaned down to 2 L.  Currently states that she is feeling much better than when she first arrived to the ER.  Review of Systems: Please see HPI for pertinent positives and negatives. A complete 10 system review of systems are otherwise negative.  Past Medical History:  Diagnosis Date   Anxiety    Asthma    Chlamydia    Depression    Hypertension    Sleep apnea    Suicidal ideation    Trichomonas infection    Past Surgical History:  Procedure Laterality Date   CESAREAN SECTION     cesearean section     Social History:  reports that she has been smoking cigarettes. She has a 3.8 pack-year smoking history. She has never used smokeless tobacco. She reports current alcohol use. She reports that she does not use  drugs.  No Known Allergies  Family History  Problem Relation Age of Onset   Cancer Mother        breast     Prior to Admission medications   Medication Sig Start Date End Date Taking? Authorizing Provider  aspirin EC 325 MG tablet Take 325 mg by mouth every 4 (four) hours as needed for moderate pain (pain score 4-6).   Yes [provider]  Phenylephrine-Pheniramine-DM [facility] COLD & COUGH) 02-09-19 MG PACK Take 1 Dose by mouth daily as needed (Cold/flu).   Yes [provider]  pseudoephedrine (SUDAFED) 120 MG 12 hr tablet Take 120 mg by mouth every 12 (twelve) hours as needed for congestion.   Yes [provider]    Physical Exam: BP (!) 181/98   Pulse 97   Temp 98 F (36.7 C) (Oral)   Resp (!) 22   Ht 5\' 4"  (1.626 m)   Wt 127.5 kg   LMP 05/21/2023   SpO2 100%   BMI 48.23 kg/m  General:  Alert, oriented, calm, in no acute distress, obese female resting comfortably on 2 L nasal cannula oxygen.  Speaking in full sentences, but gets a little bit dyspneic with speech.  Intermittent dry cough. Cardiovascular: RRR, no murmurs or rubs, no peripheral edema  Respiratory: Breath sounds are quite distant, no active wheezing, some mild rhonchi.  Significant tachypnea, retractions, or other evidence of respiratory distress. Abdomen: soft, nontender, nondistended,  normal bowel tones heard  Skin: dry, no rashes  Musculoskeletal: no joint effusions, normal range of motion  Psychiatric: appropriate affect, normal speech  Neurologic: extraocular muscles intact, clear speech, moving all extremities with intact sensorium         Labs on Admission:  Basic Metabolic Panel: Recent Labs  Lab 06/05/23 0024  NA 138  K 3.2*  CL 102  CO2 27  GLUCOSE 123*  BUN 10  CREATININE 0.67  CALCIUM 8.9   Liver Function Tests: No results for input(s): "AST", "ALT", "ALKPHOS", "BILITOT", "PROT", "ALBUMIN" in the last 168 hours. No results for input(s): "LIPASE", "AMYLASE"  in the last 168 hours. No results for input(s): "AMMONIA" in the last 168 hours. CBC: Recent Labs  Lab 06/05/23 0024  WBC 15.1*  NEUTROABS 13.3*  HGB 10.9*  HCT 36.4  MCV 89.4  PLT 397   Cardiac Enzymes: No results for input(s): "CKTOTAL", "CKMB", "CKMBINDEX", "TROPONINI" in the last 168 hours. BNP (last 3 results) Recent Labs    06/05/23 0024  BNP 66.9    ProBNP (last 3 results) No results for input(s): "PROBNP" in the last 8760 hours.  CBG: No results for input(s): "GLUCAP" in the last 168 hours.  Radiological Exams on Admission: CT Angio Chest PE W and/or Wo Contrast Result Date: 06/05/2023 CLINICAL DATA:  Wheezing and shortness of breath. EXAM: CT ANGIOGRAPHY CHEST WITH CONTRAST TECHNIQUE: Multidetector CT imaging of the chest was performed using the standard protocol during bolus administration of intravenous contrast. Multiplanar CT image reconstructions and MIPs were obtained to evaluate the vascular anatomy. RADIATION DOSE REDUCTION: This exam was performed according to the departmental dose-optimization program which includes automated exposure control, adjustment of the mA and/or kV according to patient size and/or use of iterative reconstruction technique. CONTRAST:  OMNIPAQUE IOHEXOL 350 MG/ML SOLN COMPARISON:  None Available. FINDINGS: Cardiovascular: The thoracic aorta is normal in appearance. Satisfactory opacification of the pulmonary arteries to the segmental level. No evidence of pulmonary embolism. There is mild cardiomegaly. No pericardial effusion. Mediastinum/Nodes: There is mild AP window lymphadenopathy. Thyroid gland, trachea, and esophagus demonstrate no significant findings. Lungs/Pleura: An 11 mm ill-defined pulmonary nodule is present within the posterior aspect of the right lung base (axial CT image 85, CT series 12/coronal reformatted image 111, CT series 6). There is mild anterior right middle lobe atelectasis and/or infiltrate. No pleural effusion  or pneumothorax is identified. Upper Abdomen: A small, ill-defined gallstones are seen within the lumen of a contracted gallbladder. Musculoskeletal: No chest wall abnormality. No acute or significant osseous findings. Review of the MIP images confirms the above findings. IMPRESSION: 1. No evidence of pulmonary embolism. 2. Mild anterior right middle lobe atelectasis and/or infiltrate. 3. 11 mm ill-defined right lower lobe pulmonary nodule. Consider one of the following in 3 months for both low-risk and high-risk individuals: (a) repeat chest CT, (b) follow-up PET-CT, or (c) tissue sampling. This recommendation follows the consensus statement: Guidelines for Management of Incidental Pulmonary Nodules Detected on CT Images: From the Fleischner Society 2017; Radiology 2017; 284:228-243. 4. Cholelithiasis. Electronically Signed   By: Aram Candela M.D.   On: 06/05/2023 02:54   DG Chest Port 1 View Result Date: 06/04/2023 CLINICAL DATA:  Wheezing and shortness of breath. EXAM: PORTABLE CHEST 1 VIEW COMPARISON:  None Available. FINDINGS: The heart size and mediastinal contours are within normal limits. Low lung volumes are noted. There is no evidence of an acute infiltrate, pleural effusion or pneumothorax. The visualized skeletal structures are unremarkable.  IMPRESSION: Low lung volumes without evidence of acute or active cardiopulmonary disease. Electronically Signed   By: Aram Candela M.D.   On: 06/04/2023 23:16   Assessment/Plan Zeena Starkel is a 43 y.o. female with medical history significant for obesity, hypertension and asthma being admitted to the hospital with sepsis due to community-acquired pneumonia and asthma exacerbation.  Sepsis-meeting criteria with tachycardia, leukocytosis, source is suspected community-acquired pneumonia. -Inpatient admission to progressive -Check lactic acid level -Treat asthma exacerbation and pneumonia as below  Acute hypoxic respiratory failure-likely due  to combination of asthma exacerbation, as well as community-acquired pneumonia -Supplemental oxygen, to keep O2 saturation greater than 90%  Community-acquired pneumonia-with increased cough, hypoxia, leukocytosis and subjective fever -Empiric IV azithromycin and IV Rocephin  Asthma exacerbation-with increased cough, rhonchi and wheezing.  Patient denies any prior history of asthma, though it is documented in her history. -Continue supplemental oxygen and treatment of pneumonia as above -Scheduled DuoNebs, as needed albuterol -Continue IV Solu-Medrol 40 mg twice daily  Obesity-BMI 48.2, complicating all aspects of care  Pulmonary nodule-patient will require repeat CT scan and possible tissue sampling in 3 months, patient was notified of this and instructed to follow-up with her PCP.  DVT prophylaxis: Lovenox     Code Status: Full Code  Consults called: None  Admission status: The appropriate patient status for this patient is INPATIENT. Inpatient status is judged to be reasonable and necessary in order to provide the required intensity of service to ensure the patient's safety. The patient's presenting symptoms, physical exam findings, and initial radiographic and laboratory data in the context of their chronic comorbidities is felt to place them at high risk for further clinical deterioration. Furthermore, it is not anticipated that the patient will be medically stable for discharge from the hospital within 2 midnights of admission.    I certify that at the point of admission it is my clinical judgment that the patient will require inpatient hospital care spanning beyond 2 midnights from the point of admission due to high intensity of service, high risk for further deterioration and high frequency of surveillance required  Time spent: 48 minutes  Dorismar Chay Sharlette Dense MD [facility] Pager [phone]  If 7PM-7AM, please contact night-coverage [email or web address] Password  [facility]  06/05/2023, 7:40 AM

## 2023-06-05 NOTE — ED Notes (Signed)
ED TO INPATIENT HANDOFF REPORT  ED Nurse Name and Phone #: Wynema Birch Name/Age/Gender Rose Gutierrez 43 y.o. female Room/Bed: WA18/WA18  Code Status   Code Status: Full Code  Home/SNF/Other Home Patient oriented to: self, place, time, and situation Is this baseline? Yes   Triage Complete: Triage complete  Chief Complaint Sepsis due to pneumonia (HCC) [J18.9, A41.9]  Triage Note Complaining of shortness of breath that started yesterday. Went to work today and the wheezing got worse. EMS gave a total of 3 breathing treatments, 2 duo nebs and one albuterol. Gave 125mg  of solumedrol as well.   Allergies No Known Allergies  Level of Care/Admitting Diagnosis ED Disposition     ED Disposition  Admit   Condition  --   Comment  Hospital Area: [facility] [[id number]]  Level of Care: Progressive [102]  Admit to Progressive based on following criteria: MULTISYSTEM THREATS such as stable sepsis, metabolic/electrolyte imbalance with or without encephalopathy that is responding to early treatment.  May admit patient to [facility] or [facility] if equivalent level of care is available:: Yes  Covid Evaluation: Confirmed COVID Negative  Diagnosis: Sepsis due to pneumonia [facility]) [[id number]]  Admitting Physician: Maryln Gottron [[id number]]  Attending Physician: Kirby Crigler, Parks Neptune [[id number]]  Certification:: I certify this patient will need inpatient services for at least 2 midnights  Expected Medical Readiness: 06/08/2023          B Medical/Surgery History Past Medical History:  Diagnosis Date   Anxiety    Asthma    Chlamydia    Depression    Hypertension    Sleep apnea    Suicidal ideation    Trichomonas infection    Past Surgical History:  Procedure Laterality Date   CESAREAN SECTION     cesearean section       A IV Location/Drains/Wounds Patient Lines/Drains/Airways Status     Active Line/Drains/Airways     Name Placement date Placement time  Site Days   Peripheral IV 06/05/23 20 G 1" Anterior;Left Forearm 06/05/23  0000  Forearm  less than 1            Intake/Output Last 24 hours  Intake/Output Summary (Last 24 hours) at 06/05/2023 1429 Last data filed at 06/05/2023 1000 Gross per 24 hour  Intake 1000 ml  Output --  Net 1000 ml    Labs/Imaging Results for orders placed or performed during the hospital encounter of 06/04/23 (from the past 48 hours)  Resp panel by RT-PCR (RSV, Flu A&B, Covid) Anterior Nasal Swab     Status: None   Collection Time: 06/04/23 11:58 PM   Specimen: Anterior Nasal Swab  Result Value Ref Range   SARS Coronavirus 2 by RT PCR NEGATIVE NEGATIVE    Comment: (NOTE) SARS-CoV-2 target nucleic acids are NOT DETECTED.  The SARS-CoV-2 RNA is generally detectable in upper respiratory specimens during the acute phase of infection. The lowest concentration of SARS-CoV-2 viral copies this assay can detect is 138 copies/mL. A negative result does not preclude SARS-Cov-2 infection and should not be used as the sole basis for treatment or other patient management decisions. A negative result may occur with  improper specimen collection/handling, submission of specimen other than nasopharyngeal swab, presence of viral mutation(s) within the areas targeted by this assay, and inadequate number of viral copies(<138 copies/mL). A negative result must be combined with clinical observations, patient history, and epidemiological information. The expected result is Negative.  Fact Sheet for Patients:  BloggerCourse.com  Fact Sheet for Healthcare Providers:  SeriousBroker.it  This test is no t yet approved or cleared by the [place] FDA and  has been authorized for detection and/or diagnosis of SARS-CoV-2 by FDA under an Emergency Use Authorization (EUA). This EUA will remain  in effect (meaning this test can be used) for the duration of the COVID-19  declaration under Section 564(b)(1) of the Act, 21 U.S.C.section 360bbb-3(b)(1), unless the authorization is terminated  or revoked sooner.       Influenza A by PCR NEGATIVE NEGATIVE   Influenza B by PCR NEGATIVE NEGATIVE    Comment: (NOTE) The Xpert Xpress SARS-CoV-2/FLU/RSV plus assay is intended as an aid in the diagnosis of influenza from Nasopharyngeal swab specimens and should not be used as a sole basis for treatment. Nasal washings and aspirates are unacceptable for Xpert Xpress SARS-CoV-2/FLU/RSV testing.  Fact Sheet for Patients: BloggerCourse.com  Fact Sheet for Healthcare Providers: SeriousBroker.it  This test is not yet approved or cleared by the [place] FDA and has been authorized for detection and/or diagnosis of SARS-CoV-2 by FDA under an Emergency Use Authorization (EUA). This EUA will remain in effect (meaning this test can be used) for the duration of the COVID-19 declaration under Section 564(b)(1) of the Act, 21 U.S.C. section 360bbb-3(b)(1), unless the authorization is terminated or revoked.     Resp Syncytial Virus by PCR NEGATIVE NEGATIVE    Comment: (NOTE) Fact Sheet for Patients: BloggerCourse.com  Fact Sheet for Healthcare Providers: SeriousBroker.it  This test is not yet approved or cleared by the [place] FDA and has been authorized for detection and/or diagnosis of SARS-CoV-2 by FDA under an Emergency Use Authorization (EUA). This EUA will remain in effect (meaning this test can be used) for the duration of the COVID-19 declaration under Section 564(b)(1) of the Act, 21 U.S.C. section 360bbb-3(b)(1), unless the authorization is terminated or revoked.  Performed at [facility], [address], [place], [place] [address]   Basic metabolic panel     Status: Abnormal   Collection Time: 06/05/23 12:24 AM  Result  Value Ref Range   Sodium 138 135 - 145 mmol/L   Potassium 3.2 (L) 3.5 - 5.1 mmol/L   Chloride 102 98 - 111 mmol/L   CO2 27 22 - 32 mmol/L   Glucose, Bld 123 (H) 70 - 99 mg/dL    Comment: Glucose reference range applies only to samples taken after fasting for at least 8 hours.   BUN 10 6 - 20 mg/dL   Creatinine, Ser 6.04 0.44 - 1.00 mg/dL   Calcium 8.9 8.9 - 54.0 mg/dL   GFR, Estimated >98 >11 mL/min    Comment: (NOTE) Calculated using the CKD-EPI Creatinine Equation (2021)    Anion gap 9 5 - 15    Comment: Performed at [facility], [address] [place], [place] [address]  Brain natriuretic peptide     Status: None   Collection Time: 06/05/23 12:24 AM  Result Value Ref Range   B Natriuretic Peptide 66.9 0.0 - 100.0 pg/mL    Comment: Performed at [facility], [address], [place], [place] [address]  CBC with Differential     Status: Abnormal   Collection Time: 06/05/23 12:24 AM  Result Value Ref Range   WBC 15.1 (H) 4.0 - 10.5 K/uL   RBC 4.07 3.87 - 5.11 MIL/uL   Hemoglobin 10.9 (L) 12.0 - 15.0 g/dL   HCT 13.0 86.5 - 78.4 %  MCV 89.4 80.0 - 100.0 fL   MCH 26.8 26.0 - 34.0 pg   MCHC 29.9 (L) 30.0 - 36.0 g/dL   RDW 40.9 81.1 - 91.4 %   Platelets 397 150 - 400 K/uL   nRBC 0.0 0.0 - 0.2 %   Neutrophils Relative % 88 %   Neutro Abs 13.3 (H) 1.7 - 7.7 K/uL   Lymphocytes Relative 6 %   Lymphs Abs 0.9 0.7 - 4.0 K/uL   Monocytes Relative 4 %   Monocytes Absolute 0.6 0.1 - 1.0 K/uL   Eosinophils Relative 1 %   Eosinophils Absolute 0.1 0.0 - 0.5 K/uL   Basophils Relative 0 %   Basophils Absolute 0.1 0.0 - 0.1 K/uL   Immature Granulocytes 1 %   Abs Immature Granulocytes 0.07 0.00 - 0.07 K/uL    Comment: Performed at [facility], [address], [place], [place] [address]  hCG, serum, qualitative     Status: None   Collection Time: 06/05/23 12:24 AM  Result Value Ref Range   Preg, Serum NEGATIVE NEGATIVE     Comment:        THE SENSITIVITY OF THIS METHODOLOGY IS >10 mIU/mL. Performed at [facility], [address] [place], [place] [address]   Lactic acid, plasma     Status: Abnormal   Collection Time: 06/05/23  7:51 AM  Result Value Ref Range   Lactic Acid, Venous 2.4 (HH) 0.5 - 1.9 mmol/L    Comment: CRITICAL RESULT CALLED TO, READ BACK BY AND VERIFIED WITH Dale [place] RN AT [phone] 06/05/2023 CONDRADA J. Performed at [facility], [address], [place], [place] [address]   HIV Antibody (routine testing w rflx)     Status: None   Collection Time: 06/05/23  7:51 AM  Result Value Ref Range   HIV Screen 4th Generation wRfx Non Reactive Non Reactive    Comment: Performed at [facility], [address], [place], [place] [address]  Lactic acid, plasma     Status: None   Collection Time: 06/05/23 11:00 AM  Result Value Ref Range   Lactic Acid, Venous 1.2 0.5 - 1.9 mmol/L    Comment: Performed at [facility], [address], [place], [place] [address]   CT Angio Chest PE W and/or Wo Contrast Result Date: 06/05/2023 CLINICAL DATA:  Wheezing and shortness of breath. EXAM: CT ANGIOGRAPHY CHEST WITH CONTRAST TECHNIQUE: Multidetector CT imaging of the chest was performed using the standard protocol during bolus administration of intravenous contrast. Multiplanar CT image reconstructions and MIPs were obtained to evaluate the vascular anatomy. RADIATION DOSE REDUCTION: This exam was performed according to the departmental dose-optimization program which includes automated exposure control, adjustment of the mA and/or kV according to patient size and/or use of iterative reconstruction technique. CONTRAST:  OMNIPAQUE IOHEXOL 350 MG/ML SOLN COMPARISON:  None Available. FINDINGS: Cardiovascular: The thoracic aorta is normal in appearance. Satisfactory opacification of the pulmonary arteries to the segmental level. No evidence of pulmonary  embolism. There is mild cardiomegaly. No pericardial effusion. Mediastinum/Nodes: There is mild AP window lymphadenopathy. Thyroid gland, trachea, and esophagus demonstrate no significant findings. Lungs/Pleura: An 11 mm ill-defined pulmonary nodule is present within the posterior aspect of the right lung base (axial CT image 85, CT series 12/coronal reformatted image 111, CT series 6). There is mild anterior right middle lobe atelectasis and/or infiltrate. No pleural effusion or pneumothorax is identified. Upper Abdomen: A small, ill-defined gallstones are  seen within the lumen of a contracted gallbladder. Musculoskeletal: No chest wall abnormality. No acute or significant osseous findings. Review of the MIP images confirms the above findings. IMPRESSION: 1. No evidence of pulmonary embolism. 2. Mild anterior right middle lobe atelectasis and/or infiltrate. 3. 11 mm ill-defined right lower lobe pulmonary nodule. Consider one of the following in 3 months for both low-risk and high-risk individuals: (a) repeat chest CT, (b) follow-up PET-CT, or (c) tissue sampling. This recommendation follows the consensus statement: Guidelines for Management of Incidental Pulmonary Nodules Detected on CT Images: From the Fleischner Society 2017; Radiology 2017; 284:228-243. 4. Cholelithiasis. Electronically Signed   By: Aram Candela M.D.   On: 06/05/2023 02:54   DG Chest Port 1 View Result Date: 06/04/2023 CLINICAL DATA:  Wheezing and shortness of breath. EXAM: PORTABLE CHEST 1 VIEW COMPARISON:  None Available. FINDINGS: The heart size and mediastinal contours are within normal limits. Low lung volumes are noted. There is no evidence of an acute infiltrate, pleural effusion or pneumothorax. The visualized skeletal structures are unremarkable. IMPRESSION: Low lung volumes without evidence of acute or active cardiopulmonary disease. Electronically Signed   By: Aram Candela M.D.   On: 06/04/2023 23:16    Pending  Labs Unresulted Labs (From admission, onward)     Start     Ordered   06/06/23 0500  Basic metabolic panel  Tomorrow morning,   R        06/05/23 0740   06/06/23 0500  CBC  Tomorrow morning,   R        06/05/23 0740            Vitals/Pain Today's Vitals   06/05/23 0945 06/05/23 1120 06/05/23 1230 06/05/23 1350  BP: (!) 183/101 (!) 189/109  (!) 163/85  Pulse: 97 95 (!) 110 96  Resp: (!) 27 (!) 25 (!) 30 (!) 30  Temp: 97.9 F (36.6 C)   98 F (36.7 C)  TempSrc:    Oral  SpO2: 99% 100% 100% 100%  Weight:      Height:      PainSc: 3    6     Isolation Precautions No active isolations  Medications Medications  enoxaparin (LOVENOX) injection 60 mg (60 mg Subcutaneous Given 06/05/23 0949)  acetaminophen (TYLENOL) tablet 650 mg (650 mg Oral Given 06/05/23 0826)    Or  acetaminophen (TYLENOL) suppository 650 mg ( Rectal See Alternative 06/05/23 0826)  ondansetron (ZOFRAN) tablet 4 mg (has no administration in time range)    Or  ondansetron (ZOFRAN) injection 4 mg (has no administration in time range)  traZODone (DESYREL) tablet 25 mg (has no administration in time range)  albuterol (PROVENTIL) (2.5 MG/3ML) 0.083% nebulizer solution 2.5 mg (has no administration in time range)  methylPREDNISolone sodium succinate (SOLU-MEDROL) 40 mg/mL injection 40 mg (40 mg Intravenous Given 06/05/23 0827)  azithromycin (ZITHROMAX) 500 mg in sodium chloride 0.9 % 250 mL IVPB (has no administration in time range)  cefTRIAXone (ROCEPHIN) 1 g in sodium chloride 0.9 % 100 mL IVPB (has no administration in time range)  ipratropium-albuterol (DUONEB) 0.5-2.5 (3) MG/3ML nebulizer solution 3 mL (3 mLs Nebulization Given 06/05/23 1136)  amLODipine (NORVASC) tablet 10 mg (10 mg Oral Given 06/05/23 0949)  hydrALAZINE (APRESOLINE) injection 5 mg (has no administration in time range)  ipratropium-albuterol (DUONEB) 0.5-2.5 (3) MG/3ML nebulizer solution 3 mL (3 mLs Nebulization Given 06/05/23 0029)  magnesium  sulfate IVPB 1 g 100 mL (0 g Intravenous Stopped 06/05/23 0127)  iohexol (OMNIPAQUE)  350 MG/ML injection 100 mL (100 mLs Intravenous Contrast Given 06/05/23 0223)  cefTRIAXone (ROCEPHIN) 1 g in sodium chloride 0.9 % 100 mL IVPB (0 g Intravenous Stopped 06/05/23 0401)  azithromycin (ZITHROMAX) 500 mg in sodium chloride 0.9 % 250 mL IVPB (0 mg Intravenous Stopped 06/05/23 0523)  potassium chloride SA (KLOR-CON M) CR tablet 40 mEq (40 mEq Oral Given 06/05/23 0826)  sodium chloride 0.9 % bolus 1,000 mL (0 mLs Intravenous Stopped 06/05/23 1000)    Mobility walks     Focused Assessments Pulmonary Assessment Handoff:  Lung sounds: Bilateral Breath Sounds: Expiratory wheezes L Breath Sounds: Diminished R Breath Sounds: Diminished O2 Device: Room Air     R Recommendations: See Admitting Provider Note  Report given to:   Additional Notes:

## 2023-06-06 ENCOUNTER — Other Ambulatory Visit (HOSPITAL_COMMUNITY): Payer: Self-pay

## 2023-06-06 ENCOUNTER — Encounter: Payer: Self-pay | Admitting: Internal Medicine

## 2023-06-06 DIAGNOSIS — A419 Sepsis, unspecified organism: Secondary | ICD-10-CM | POA: Diagnosis not present

## 2023-06-06 DIAGNOSIS — Z87891 Personal history of nicotine dependence: Secondary | ICD-10-CM | POA: Insufficient documentation

## 2023-06-06 DIAGNOSIS — R911 Solitary pulmonary nodule: Secondary | ICD-10-CM

## 2023-06-06 DIAGNOSIS — J4521 Mild intermittent asthma with (acute) exacerbation: Secondary | ICD-10-CM

## 2023-06-06 DIAGNOSIS — J189 Pneumonia, unspecified organism: Secondary | ICD-10-CM | POA: Diagnosis not present

## 2023-06-06 DIAGNOSIS — J45901 Unspecified asthma with (acute) exacerbation: Secondary | ICD-10-CM

## 2023-06-06 DIAGNOSIS — Z720 Tobacco use: Secondary | ICD-10-CM | POA: Insufficient documentation

## 2023-06-06 LAB — CBC
HCT: 35.1 % — ABNORMAL LOW (ref 36.0–46.0)
Hemoglobin: 10 g/dL — ABNORMAL LOW (ref 12.0–15.0)
MCH: 26.2 pg (ref 26.0–34.0)
MCHC: 28.5 g/dL — ABNORMAL LOW (ref 30.0–36.0)
MCV: 91.9 fL (ref 80.0–100.0)
Platelets: 403 10*3/uL — ABNORMAL HIGH (ref 150–400)
RBC: 3.82 MIL/uL — ABNORMAL LOW (ref 3.87–5.11)
RDW: 14.6 % (ref 11.5–15.5)
WBC: 22 10*3/uL — ABNORMAL HIGH (ref 4.0–10.5)
nRBC: 0 % (ref 0.0–0.2)

## 2023-06-06 LAB — BASIC METABOLIC PANEL
Anion gap: 9 (ref 5–15)
BUN: 18 mg/dL (ref 6–20)
CO2: 25 mmol/L (ref 22–32)
Calcium: 9.1 mg/dL (ref 8.9–10.3)
Chloride: 102 mmol/L (ref 98–111)
Creatinine, Ser: 0.63 mg/dL (ref 0.44–1.00)
GFR, Estimated: >60 mL/min (ref >60)
Glucose, Bld: 139 mg/dL — ABNORMAL HIGH (ref 70–99)
Potassium: 4.6 mmol/L (ref 3.5–5.1)
Sodium: 136 mmol/L (ref 135–145)

## 2023-06-06 LAB — PROCALCITONIN: Procalcitonin: <0.1 ng/mL

## 2023-06-06 MED ORDER — AMOXICILLIN 500 MG PO CAPS
1000.0000 mg | ORAL_CAPSULE | Freq: Three times a day (TID) | ORAL | Status: DC
  Administered 2023-06-06: 1000 mg via ORAL
  Filled 2023-06-06: qty 2

## 2023-06-06 MED ORDER — AMLODIPINE BESYLATE 10 MG PO TABS
10.0000 mg | ORAL_TABLET | Freq: Every day | ORAL | 1 refills | Status: DC
  Filled 2023-06-06: qty 90, 90d supply, fill #0
  Filled 2023-09-11: qty 90, 90d supply, fill #1

## 2023-06-06 MED ORDER — PREDNISONE 20 MG PO TABS
40.0000 mg | ORAL_TABLET | Freq: Every day | ORAL | 0 refills | Status: AC
  Filled 2023-06-06: qty 10, 5d supply, fill #0

## 2023-06-06 MED ORDER — AZITHROMYCIN 500 MG PO TABS
500.0000 mg | ORAL_TABLET | Freq: Every day | ORAL | 0 refills | Status: AC
  Filled 2023-06-06: qty 3, 3d supply, fill #0

## 2023-06-06 MED ORDER — AZITHROMYCIN 250 MG PO TABS: 500.0000 mg | ORAL_TABLET | Freq: Every day | ORAL | Status: DC

## 2023-06-06 MED ORDER — AMOXICILLIN 500 MG PO CAPS
1000.0000 mg | ORAL_CAPSULE | Freq: Three times a day (TID) | ORAL | 0 refills | Status: AC
  Filled 2023-06-06: qty 18, 3d supply, fill #0

## 2023-06-06 MED ORDER — HYDROCOD POLI-CHLORPHE POLI ER 10-8 MG/5ML PO SUER
5.0000 mL | Freq: Two times a day (BID) | ORAL | 0 refills | Status: DC
  Filled 2023-06-06: qty 70, 7d supply, fill #0

## 2023-06-06 NOTE — Plan of Care (Signed)
Problem: Health Behavior/Discharge Planning: Goal: Ability to manage health-related needs will improve Outcome: Progressing   Problem: Clinical Measurements: Goal: Ability to maintain clinical measurements within normal limits will improve Outcome: Progressing Goal: Will remain free from infection Outcome: Progressing Goal: Diagnostic test results will improve Outcome: Progressing Goal: Respiratory complications will improve Outcome: Progressing

## 2023-06-06 NOTE — Discharge Instructions (Signed)
You blood pressure has been elevated. Recommend to start on amlodipine for blood pressure control and try to follow low salt diet as well.   Please try to check your blood pressure routinely and bring a log of values to follow up appointment so this can be looked at as well.

## 2023-06-06 NOTE — Discharge Summary (Signed)
Physician Discharge Summary   Rose Gutierrez RUE:[id number] DOB: [birth year] DOA: 06/04/2023  PCP: Patient, No Pcp Per  Admit date: 06/04/2023 Discharge date: 06/06/2023  Admitted From: Home Disposition:  Home Discharging physician: Lewie Chamber, MD Barriers to discharge: none  Recommendations at discharge: Repeat CT chest in 3 months to re-eval 11 mm RLL nodule vs biopsy Patient strongly counseled to quit smoking   Discharge Condition: stable CODE STATUS: Full Diet recommendation:  Diet Orders (From admission, onward)     Start     Ordered   06/06/23 0000  Diet - low sodium heart healthy        06/06/23 1134   06/05/23 0739  Diet regular Room service appropriate? Yes; Fluid consistency: Thin  Diet effective now       Question Answer Comment  Room service appropriate? Yes   Fluid consistency: Thin      06/05/23 0740            Hospital Course: Rose Gutierrez is a 43 y.o. female with medical history significant for obesity, hypertension and asthma, and ongoing tobacco use. She presented with cough, dyspnea, wheezing.  She continues to smoke approximately 0.5-1 PPD. She underwent further workup on admission.  CTA chest negative for PE and showed some concern for mild right middle lobe atelectasis versus infiltrate.  She was also noted to have 11 mm right lower lobe pulmonary nodule which was personally discussed with her especially regarding smoking cessation and outpatient follow-up for repeat imaging and establishing with primary care.  TOC was consulted prior to discharge for assistance with outpatient follow-up appointment. She ambulated with staff prior to discharge on room air with no oxygen desaturations. She was prescribed amoxicillin and azithromycin to complete course at discharge along with course of prednisone for presumed asthma exacerbation. Blood pressure was also elevated on admission and she was prescribed amlodipine to continue at discharge.  She again was  stressed importance of following up outpatient and establishing care with primary care.   The patient's acute and chronic medical conditions were treated accordingly. On day of discharge, patient was felt deemed stable for discharge. Patient/family member advised to call PCP or come back to ER if needed.   Principal Diagnosis: Sepsis due to pneumonia [facility])  Discharge Diagnoses: Active Hospital Problems   Diagnosis Date Noted   Sepsis due to pneumonia (HCC) 06/05/2023    Priority: 1.   Lung nodule 06/06/2023    Priority: 2.   Asthma exacerbation 06/06/2023    Priority: 2.   Tobacco use 06/06/2023   Morbid obesity (HCC) 04/29/2014   HTN (hypertension) 04/29/2014    Resolved Hospital Problems  No resolved problems to display.     Discharge Instructions     Diet - low sodium heart healthy   Complete by: As directed    Increase activity slowly   Complete by: As directed       Allergies as of 06/06/2023   No Known Allergies      Medication List     TAKE these medications    amLODipine 10 MG tablet Commonly known as: NORVASC Take 1 tablet (10 mg total) by mouth daily. Start taking on: June 07, 2023   amoxicillin 500 MG capsule Commonly known as: AMOXIL Take 2 capsules (1,000 mg total) by mouth 3 (three) times daily for 3 days.   aspirin EC 325 MG tablet Take 325 mg by mouth every 4 (four) hours as needed for moderate pain (pain score 4-6).   azithromycin 500  MG tablet Commonly known as: ZITHROMAX Take 1 tablet (500 mg total) by mouth daily for 3 days. Start taking on: June 07, 2023   chlorpheniramine-HYDROcodone 10-8 MG/5ML Commonly known as: TUSSIONEX Take 5 mLs by mouth 2 (two) times daily.   predniSONE 20 MG tablet Commonly known as: DELTASONE Take 2 tablets (40 mg total) by mouth daily with breakfast for 5 days.   pseudoephedrine 120 MG 12 hr tablet Commonly known as: SUDAFED Take 120 mg by mouth every 12 (twelve) hours as needed for  congestion.   Theraflu Cold & Cough 02-09-19 MG Pack Generic drug: Phenylephrine-Pheniramine-DM Take 1 Dose by mouth daily as needed (Cold/flu).        Follow-up Information     [name], FNP. Go on 07/14/2023.   Specialty: Nurse Practitioner Why: Monday July 14, 2023 9:40 AM Contact information: [address] [place] [address] [place], [place] [address] [phone]                No Known Allergies  Consultations:   Procedures:   Discharge Exam: BP (!) 140/90 (BP Location: Left Arm)   Pulse 89   Temp 97.6 F (36.4 C) (Oral)   Resp 18   Ht 5\' 4"  (1.626 m)   Wt 127.5 kg   LMP 05/21/2023   SpO2 99%   BMI 48.23 kg/m  Physical Exam Constitutional:      Appearance: Normal appearance.  HENT:     Head: Normocephalic and atraumatic.     Mouth/Throat:     Mouth: Mucous membranes are moist.  Eyes:     Extraocular Movements: Extraocular movements intact.  Cardiovascular:     Rate and Rhythm: Normal rate and regular rhythm.  Pulmonary:     Effort: Pulmonary effort is normal. No respiratory distress.     Breath sounds: Wheezing present.     Comments: Coarse breath sounds bilaterally Abdominal:     General: Bowel sounds are normal. There is no distension.     Palpations: Abdomen is soft.     Tenderness: There is no abdominal tenderness.  Musculoskeletal:        General: Normal range of motion.     Cervical back: Normal range of motion and neck supple.  Skin:    General: Skin is warm and dry.  Neurological:     General: No focal deficit present.     Mental Status: She is alert.  Psychiatric:        Mood and Affect: Mood normal.      The results of significant diagnostics from this hospitalization (including imaging, microbiology, ancillary and laboratory) are listed below for reference.   Microbiology: Recent Results (from the past 240 hours)  Resp panel by RT-PCR (RSV, Flu A&B, Covid) Anterior Nasal Swab     Status: None   Collection Time:  06/04/23 11:58 PM   Specimen: Anterior Nasal Swab  Result Value Ref Range Status   SARS Coronavirus 2 by RT PCR NEGATIVE NEGATIVE Final    Comment: (NOTE) SARS-CoV-2 target nucleic acids are NOT DETECTED.  The SARS-CoV-2 RNA is generally detectable in upper respiratory specimens during the acute phase of infection. The lowest concentration of SARS-CoV-2 viral copies this assay can detect is 138 copies/mL. A negative result does not preclude SARS-Cov-2 infection and should not be used as the sole basis for treatment or other patient management decisions. A negative result may occur with  improper specimen collection/handling, submission of specimen other than nasopharyngeal swab, presence of viral mutation(s) within  the areas targeted by this assay, and inadequate number of viral copies(<138 copies/mL). A negative result must be combined with clinical observations, patient history, and epidemiological information. The expected result is Negative.  Fact Sheet for Patients:  BloggerCourse.com  Fact Sheet for Healthcare Providers:  SeriousBroker.it  This test is no t yet approved or cleared by the [place] FDA and  has been authorized for detection and/or diagnosis of SARS-CoV-2 by FDA under an Emergency Use Authorization (EUA). This EUA will remain  in effect (meaning this test can be used) for the duration of the COVID-19 declaration under Section 564(b)(1) of the Act, 21 U.S.C.section 360bbb-3(b)(1), unless the authorization is terminated  or revoked sooner.       Influenza A by PCR NEGATIVE NEGATIVE Final   Influenza B by PCR NEGATIVE NEGATIVE Final    Comment: (NOTE) The Xpert Xpress SARS-CoV-2/FLU/RSV plus assay is intended as an aid in the diagnosis of influenza from Nasopharyngeal swab specimens and should not be used as a sole basis for treatment. Nasal washings and aspirates are unacceptable for Xpert Xpress  SARS-CoV-2/FLU/RSV testing.  Fact Sheet for Patients: BloggerCourse.com  Fact Sheet for Healthcare Providers: SeriousBroker.it  This test is not yet approved or cleared by the [place] FDA and has been authorized for detection and/or diagnosis of SARS-CoV-2 by FDA under an Emergency Use Authorization (EUA). This EUA will remain in effect (meaning this test can be used) for the duration of the COVID-19 declaration under Section 564(b)(1) of the Act, 21 U.S.C. section 360bbb-3(b)(1), unless the authorization is terminated or revoked.     Resp Syncytial Virus by PCR NEGATIVE NEGATIVE Final    Comment: (NOTE) Fact Sheet for Patients: BloggerCourse.com  Fact Sheet for Healthcare Providers: SeriousBroker.it  This test is not yet approved or cleared by the [place] FDA and has been authorized for detection and/or diagnosis of SARS-CoV-2 by FDA under an Emergency Use Authorization (EUA). This EUA will remain in effect (meaning this test can be used) for the duration of the COVID-19 declaration under Section 564(b)(1) of the Act, 21 U.S.C. section 360bbb-3(b)(1), unless the authorization is terminated or revoked.  Performed at [facility], [address], [place], [place] [address]      Labs: BNP (last 3 results) Recent Labs    06/05/23 0024  BNP 66.9   Basic Metabolic Panel: Recent Labs  Lab 06/05/23 0024 06/06/23 0413  NA 138 136  K 3.2* 4.6  CL 102 102  CO2 27 25  GLUCOSE 123* 139*  BUN 10 18  CREATININE 0.67 0.63  CALCIUM 8.9 9.1   Liver Function Tests: No results for input(s): "AST", "ALT", "ALKPHOS", "BILITOT", "PROT", "ALBUMIN" in the last 168 hours. No results for input(s): "LIPASE", "AMYLASE" in the last 168 hours. No results for input(s): "AMMONIA" in the last 168 hours. CBC: Recent Labs  Lab 06/05/23 0024 06/06/23 0413   WBC 15.1* 22.0*  NEUTROABS 13.3*  --   HGB 10.9* 10.0*  HCT 36.4 35.1*  MCV 89.4 91.9  PLT 397 403*   Cardiac Enzymes: No results for input(s): "CKTOTAL", "CKMB", "CKMBINDEX", "TROPONINI" in the last 168 hours. BNP: Invalid input(s): "POCBNP" CBG: No results for input(s): "GLUCAP" in the last 168 hours. D-Dimer No results for input(s): "DDIMER" in the last 72 hours. Hgb A1c No results for input(s): "HGBA1C" in the last 72 hours. Lipid Profile No results for input(s): "CHOL", "HDL", "LDLCALC", "TRIG", "CHOLHDL", "LDLDIRECT" in the last 72 hours. Thyroid function studies No results  for input(s): "TSH", "T4TOTAL", "T3FREE", "THYROIDAB" in the last 72 hours.  Invalid input(s): "FREET3" Anemia work up No results for input(s): "VITAMINB12", "FOLATE", "FERRITIN", "TIBC", "IRON", "RETICCTPCT" in the last 72 hours. Urinalysis    Component Value Date/Time   COLORURINE YELLOW 05/24/2015 1637   APPEARANCEUR CLEAR 05/24/2015 1637   LABSPEC 1.015 05/24/2015 1637   PHURINE 8.5 (H) 05/24/2015 1637   GLUCOSEU NEGATIVE 05/24/2015 1637   HGBUR MODERATE (A) 05/24/2015 1637   BILIRUBINUR NEGATIVE 05/24/2015 1637   KETONESUR NEGATIVE 05/24/2015 1637   PROTEINUR NEGATIVE 05/24/2015 1637   UROBILINOGEN 0.2 03/19/2014 1640   NITRITE NEGATIVE 05/24/2015 1637   LEUKOCYTESUR NEGATIVE 05/24/2015 1637   Sepsis Labs Recent Labs  Lab 06/05/23 0024 06/06/23 0413  WBC 15.1* 22.0*   Microbiology Recent Results (from the past 240 hours)  Resp panel by RT-PCR (RSV, Flu A&B, Covid) Anterior Nasal Swab     Status: None   Collection Time: 06/04/23 11:58 PM   Specimen: Anterior Nasal Swab  Result Value Ref Range Status   SARS Coronavirus 2 by RT PCR NEGATIVE NEGATIVE Final    Comment: (NOTE) SARS-CoV-2 target nucleic acids are NOT DETECTED.  The SARS-CoV-2 RNA is generally detectable in upper respiratory specimens during the acute phase of infection. The lowest concentration of SARS-CoV-2 viral  copies this assay can detect is 138 copies/mL. A negative result does not preclude SARS-Cov-2 infection and should not be used as the sole basis for treatment or other patient management decisions. A negative result may occur with  improper specimen collection/handling, submission of specimen other than nasopharyngeal swab, presence of viral mutation(s) within the areas targeted by this assay, and inadequate number of viral copies(<138 copies/mL). A negative result must be combined with clinical observations, patient history, and epidemiological information. The expected result is Negative.  Fact Sheet for Patients:  BloggerCourse.com  Fact Sheet for Healthcare Providers:  SeriousBroker.it  This test is no t yet approved or cleared by the [place] FDA and  has been authorized for detection and/or diagnosis of SARS-CoV-2 by FDA under an Emergency Use Authorization (EUA). This EUA will remain  in effect (meaning this test can be used) for the duration of the COVID-19 declaration under Section 564(b)(1) of the Act, 21 U.S.C.section 360bbb-3(b)(1), unless the authorization is terminated  or revoked sooner.       Influenza A by PCR NEGATIVE NEGATIVE Final   Influenza B by PCR NEGATIVE NEGATIVE Final    Comment: (NOTE) The Xpert Xpress SARS-CoV-2/FLU/RSV plus assay is intended as an aid in the diagnosis of influenza from Nasopharyngeal swab specimens and should not be used as a sole basis for treatment. Nasal washings and aspirates are unacceptable for Xpert Xpress SARS-CoV-2/FLU/RSV testing.  Fact Sheet for Patients: BloggerCourse.com  Fact Sheet for Healthcare Providers: SeriousBroker.it  This test is not yet approved or cleared by the [place] FDA and has been authorized for detection and/or diagnosis of SARS-CoV-2 by FDA under an Emergency Use Authorization (EUA). This  EUA will remain in effect (meaning this test can be used) for the duration of the COVID-19 declaration under Section 564(b)(1) of the Act, 21 U.S.C. section 360bbb-3(b)(1), unless the authorization is terminated or revoked.     Resp Syncytial Virus by PCR NEGATIVE NEGATIVE Final    Comment: (NOTE) Fact Sheet for Patients: BloggerCourse.com  Fact Sheet for Healthcare Providers: SeriousBroker.it  This test is not yet approved or cleared by the [place] FDA and has been authorized for detection and/or diagnosis of  SARS-CoV-2 by FDA under an Emergency Use Authorization (EUA). This EUA will remain in effect (meaning this test can be used) for the duration of the COVID-19 declaration under Section 564(b)(1) of the Act, 21 U.S.C. section 360bbb-3(b)(1), unless the authorization is terminated or revoked.  Performed at [facility], [address] [place], [place] [address]     Procedures/Studies: CT Angio Chest PE W and/or Wo Contrast Result Date: 06/05/2023 CLINICAL DATA:  Wheezing and shortness of breath. EXAM: CT ANGIOGRAPHY CHEST WITH CONTRAST TECHNIQUE: Multidetector CT imaging of the chest was performed using the standard protocol during bolus administration of intravenous contrast. Multiplanar CT image reconstructions and MIPs were obtained to evaluate the vascular anatomy. RADIATION DOSE REDUCTION: This exam was performed according to the departmental dose-optimization program which includes automated exposure control, adjustment of the mA and/or kV according to patient size and/or use of iterative reconstruction technique. CONTRAST:  OMNIPAQUE IOHEXOL 350 MG/ML SOLN COMPARISON:  None Available. FINDINGS: Cardiovascular: The thoracic aorta is normal in appearance. Satisfactory opacification of the pulmonary arteries to the segmental level. No evidence of pulmonary embolism. There is mild cardiomegaly. No  pericardial effusion. Mediastinum/Nodes: There is mild AP window lymphadenopathy. Thyroid gland, trachea, and esophagus demonstrate no significant findings. Lungs/Pleura: An 11 mm ill-defined pulmonary nodule is present within the posterior aspect of the right lung base (axial CT image 85, CT series 12/coronal reformatted image 111, CT series 6). There is mild anterior right middle lobe atelectasis and/or infiltrate. No pleural effusion or pneumothorax is identified. Upper Abdomen: A small, ill-defined gallstones are seen within the lumen of a contracted gallbladder. Musculoskeletal: No chest wall abnormality. No acute or significant osseous findings. Review of the MIP images confirms the above findings. IMPRESSION: 1. No evidence of pulmonary embolism. 2. Mild anterior right middle lobe atelectasis and/or infiltrate. 3. 11 mm ill-defined right lower lobe pulmonary nodule. Consider one of the following in 3 months for both low-risk and high-risk individuals: (a) repeat chest CT, (b) follow-up PET-CT, or (c) tissue sampling. This recommendation follows the consensus statement: Guidelines for Management of Incidental Pulmonary Nodules Detected on CT Images: From the Fleischner Society 2017; Radiology 2017; 284:228-243. 4. Cholelithiasis. Electronically Signed   By: Aram Candela M.D.   On: 06/05/2023 02:54   DG Chest Port 1 View Result Date: 06/04/2023 CLINICAL DATA:  Wheezing and shortness of breath. EXAM: PORTABLE CHEST 1 VIEW COMPARISON:  None Available. FINDINGS: The heart size and mediastinal contours are within normal limits. Low lung volumes are noted. There is no evidence of an acute infiltrate, pleural effusion or pneumothorax. The visualized skeletal structures are unremarkable. IMPRESSION: Low lung volumes without evidence of acute or active cardiopulmonary disease. Electronically Signed   By: Aram Candela M.D.   On: 06/04/2023 23:16     Time coordinating discharge: Over 30  minutes    Lewie Chamber, MD  [facility] 06/06/2023, 1:34 PM

## 2023-06-06 NOTE — Hospital Course (Signed)
Rose Gutierrez is a 43 y.o. female with medical history significant for obesity, hypertension and asthma, and ongoing tobacco use. She presented with cough, dyspnea, wheezing.  She continues to smoke approximately 0.5-1 PPD. She underwent further workup on admission.  CTA chest negative for PE and showed some concern for mild right middle lobe atelectasis versus infiltrate.  She was also noted to have 11 mm right lower lobe pulmonary nodule which was personally discussed with her especially regarding smoking cessation and outpatient follow-up for repeat imaging and establishing with primary care.  TOC was consulted prior to discharge for assistance with outpatient follow-up appointment. She ambulated with staff prior to discharge on room air with no oxygen desaturations. She was prescribed amoxicillin and azithromycin to complete course at discharge along with course of prednisone for presumed asthma exacerbation. Blood pressure was also elevated on admission and she was prescribed amlodipine to continue at discharge.  She again was stressed importance of following up outpatient and establishing care with primary care.

## 2023-06-06 NOTE — Progress Notes (Signed)
TOC meds in a secure bag delivered to pt in room by this RN.

## 2023-06-06 NOTE — TOC Transition Note (Signed)
Transition of Care [facility]) - Discharge Note   Patient Details  Name: Rose Gutierrez MRN: [id number] Date of Birth: 1980/07/08  Transition of [facility]) CM/SW Contact:  Larrie Kass, LCSW Phone Number: 06/06/2023, 12:35 PM   Clinical Narrative:    CSW received a consult for PCP. CSW met with the pt to discuss SDOH and PCP needs. While speaking with the pt, she was on the phone with her Medicaid worker. Pt reports she is waiting for approval for Medicaid. She stated that she does not have insurance but is able to pay for her medications when she gets paid next week. Pt also stated she does not have a PCP and gave permission to make an appointment to establish care. [facility] for Monday, July 14, 2023, at 9:40 AM. The appointment was added to the pt's AVS. CSW provided resources for food and housing. Pt's medications will be delivered to her room prior to d/c. TOC sign off.   Final next level of care: Home/Self Care Barriers to Discharge: Barriers Resolved   Patient Goals and CMS Choice Patient states their goals for this hospitalization and ongoing recovery are:: retrun home          Discharge Placement                       Discharge Plan and Services Additional resources added to the After Visit Summary for                                       Social Drivers of Health (SDOH) Interventions SDOH Screenings   Food Insecurity: Food Insecurity Present (06/05/2023)  Housing: High Risk (06/05/2023)  Transportation Needs: No Transportation Needs (06/05/2023)  Utilities: Not At Risk (06/05/2023)  Social Connections: Moderately Isolated (06/05/2023)  Tobacco Use: High Risk (06/05/2023)     Readmission Risk Interventions     No data to display

## 2023-06-21 DIAGNOSIS — Z419 Encounter for procedure for purposes other than remedying health state, unspecified: Secondary | ICD-10-CM | POA: Diagnosis not present

## 2023-07-02 DIAGNOSIS — Z419 Encounter for procedure for purposes other than remedying health state, unspecified: Secondary | ICD-10-CM | POA: Diagnosis not present

## 2023-07-14 ENCOUNTER — Encounter: Payer: Self-pay | Admitting: Nurse Practitioner

## 2023-07-14 ENCOUNTER — Ambulatory Visit (INDEPENDENT_AMBULATORY_CARE_PROVIDER_SITE_OTHER): Payer: Self-pay | Admitting: Nurse Practitioner

## 2023-07-14 DIAGNOSIS — R0602 Shortness of breath: Secondary | ICD-10-CM | POA: Diagnosis not present

## 2023-07-14 DIAGNOSIS — R911 Solitary pulmonary nodule: Secondary | ICD-10-CM

## 2023-07-14 DIAGNOSIS — Z720 Tobacco use: Secondary | ICD-10-CM | POA: Diagnosis not present

## 2023-07-14 DIAGNOSIS — I10 Essential (primary) hypertension: Secondary | ICD-10-CM | POA: Diagnosis not present

## 2023-07-14 DIAGNOSIS — R351 Nocturia: Secondary | ICD-10-CM

## 2023-07-14 DIAGNOSIS — F419 Anxiety disorder, unspecified: Secondary | ICD-10-CM | POA: Diagnosis not present

## 2023-07-14 DIAGNOSIS — J309 Allergic rhinitis, unspecified: Secondary | ICD-10-CM | POA: Diagnosis not present

## 2023-07-14 DIAGNOSIS — G4733 Obstructive sleep apnea (adult) (pediatric): Secondary | ICD-10-CM | POA: Diagnosis not present

## 2023-07-14 DIAGNOSIS — F32A Depression, unspecified: Secondary | ICD-10-CM

## 2023-07-14 DIAGNOSIS — F17210 Nicotine dependence, cigarettes, uncomplicated: Secondary | ICD-10-CM

## 2023-07-14 DIAGNOSIS — R7303 Prediabetes: Secondary | ICD-10-CM

## 2023-07-14 LAB — POCT GLYCOSYLATED HEMOGLOBIN (HGB A1C): Hemoglobin A1C: 6.1 % — AB (ref 4.0–5.6)

## 2023-07-14 MED ORDER — ALBUTEROL SULFATE HFA 108 (90 BASE) MCG/ACT IN AERS: 2.0000 | INHALATION_SPRAY | Freq: Four times a day (QID) | RESPIRATORY_TRACT | 2 refills | Status: AC | PRN

## 2023-07-14 MED ORDER — BUPROPION HCL ER (SR) 150 MG PO TB12: ORAL_TABLET | ORAL | 3 refills | Status: DC

## 2023-07-14 MED ORDER — LORATADINE 10 MG PO TABS
10.0000 mg | ORAL_TABLET | Freq: Every day | ORAL | 4 refills | Status: DC
Start: 2023-07-14 — End: 2023-11-10

## 2023-07-14 MED ORDER — NICOTINE 21 MG/24HR TD PT24: 21.0000 mg | MEDICATED_PATCH | Freq: Every day | TRANSDERMAL | 0 refills | Status: DC

## 2023-07-14 NOTE — Assessment & Plan Note (Signed)
 Smokes about 1 pack/day  Asked about quitting: confirms that he/she currently smokes cigarettes Advise to quit smoking: Educated about QUITTING to reduce the risk of cancer, cardio and cerebrovascular disease. Assess willingness: willing to quit at this time, and is working on cutting back. Assist with counseling and pharmacotherapy: Counseled for 5 minutes and literature provided. Arrange for follow up: follow up in 2 months and continue to offer help.

## 2023-07-14 NOTE — Assessment & Plan Note (Signed)
 Patient encouraged to drink at least 64 ounces of water daily and during the daytime to minimize getting up at bedtime to urinate A1c sugar prediabetes

## 2023-07-14 NOTE — Patient Instructions (Addendum)
 Please consider getting Influenza and pneumococcal  vaccine at local pharmacy.   Please stop taking teraflu, its ok to take over the counter mucinex as needed for cough   1. Morbid obesity (HCC) (Primary)   2. Tobacco use  -- buPROPion (WELLBUTRIN SR) 150 MG 12 hr tablet; Take  150 mg once daily for 3 days; increase to 150 mg twice daily  Dispense: 60 tablet; Refill: 3  3. Primary hypertension   4. Allergic rhinitis, unspecified seasonality, unspecified trigger  - loratadine (CLARITIN) 10 MG tablet; Take 1 tablet (10 mg total) by mouth daily.  Dispense: 30 tablet; Refill: 4  5. Shortness of breath  - albuterol (VENTOLIN HFA) 108 (90 Base) MCG/ACT inhaler; Inhale 2 puffs into the lungs every 6 (six) hours as needed for wheezing or shortness of breath.  Dispense: 8 g; Refill: 2 - Ambulatory referral to Pulmonology  6. OSA (obstructive sleep apnea)  - Home sleep test; Future  7. Lung nodule  - Ambulatory referral to Pulmonology   Anxiety and depression  - buPROPion (WELLBUTRIN SR) 150 MG 12 hr tablet; Take  150 mg once daily for 3 days; increase to 150 mg twice daily  Dispense: 60 tablet; Refill: 3      It is important that you exercise regularly at least 30 minutes 5 times a week as tolerated  Think about what you will eat, plan ahead. Choose " clean, green, fresh or frozen" over canned, processed or packaged foods which are more sugary, salty and fatty. 70 to 75% of food eaten should be vegetables and fruit. Three meals at set times with snacks allowed between meals, but they must be fruit or vegetables. Aim to eat over a 12 hour period , example 7 am to 7 pm, and STOP after  your last meal of the day. Drink water,generally about 64 ounces per day, no other drink is as healthy. Fruit juice is best enjoyed in a healthy way, by EATING the fruit.  Thanks for [facility] we consider it a privelige to serve you.

## 2023-07-14 NOTE — Assessment & Plan Note (Addendum)
 Smoking cessation encouraged - albuterol (VENTOLIN HFA) 108 (90 Base) MCG/ACT inhaler; Inhale 2 puffs into the lungs every 6 (six) hours as needed for wheezing or shortness of breath.  Dispense: 8 g; Refill: 2 - Ambulatory referral to Pulmonology

## 2023-07-14 NOTE — Assessment & Plan Note (Signed)
 Smoking cessation encouraged Patient referred to pulmonology    IMPRESSION: 1. No evidence of pulmonary embolism. 2. Mild anterior right middle lobe atelectasis and/or infiltrate. 3. 11 mm ill-defined right lower lobe pulmonary nodule. Consider one of the following in 3 months for both low-risk and high-risk individuals: (a) repeat chest CT, (b) follow-up PET-CT, or (c) tissue sampling. This recommendation follows the consensus statement: Guidelines for Management of Incidental Pulmonary Nodules Detected on CT Images: From the Fleischner Society 2017; Radiology

## 2023-07-14 NOTE — Assessment & Plan Note (Signed)
 Wt Readings from Last 3 Encounters:  07/14/23 (!) 334 lb 6.4 oz (151.7 kg)  06/04/23 281 lb (127.5 kg)  04/18/20 281 lb (127.5 kg)   Body mass index is 57.4 kg/m.  Stated that she has been watching her diet and has been trying to do some exercises Patient counseled on low-carb diet Encouraged to engage in regular moderate to vigorous exercises at least  150 minutes weekly as tolerated Benefits of healthy weights discussed

## 2023-07-14 NOTE — Progress Notes (Signed)
 New Patient Office Visit  Subjective:  Patient ID: Rose Gutierrez, female    DOB: Aug 08, 1980  Age: 43 y.o. MRN: [id number]  CC:  Chief Complaint  Patient presents with   Establish Care    HPI Rose Gutierrez is a 43 y.o. female  has a past medical history of Anxiety, Asthma, Chlamydia, Depression, Hypertension, Sleep apnea, Suicidal ideation, and Trichomonas infection.  Patient presents to establish care for her chronic medical conditions  Patient was on admission from 06/04/2023 to 2-14 2025 for shortness of breath, community-acquired pneumonia.  She was treated with azithromycin and amoxicillin which she has completed.  Also prescribed prednisone for presumed asthma exacerbation, she was noted to have 11 mm right lower lobe pulmonary nodule on a CTA, there is a recommendation for a repeat CTA in 3 months  Hypertension.  Takes amlodipine 10 mg daily, stated that she has not taken the medication today.  She denies chest pain, edema, stated that she has noticed that she feels sleepy after taking the medication and plans to start taking amlodipine at bedtime  Tobacco use disorder.  Started smoking around age 77 sometimes smokes 1 pack of cigarettes daily.  She reports shortness of breath on some days, wheezing and cough, states that her cough is better . She is interested in smoking cessation   Anxiety and depression.  Stated that she is currently considered homeless lives in a hotel with her 2 children, her car broke down last week ,she is trying to find a place to live she denies SI, HI  Obstructive sleep apnea.  Stated that she had a sleep study done about 12 years ago, she never got a prescription for a CPAP before moving to [place].   Allergies.  Patient complains of stuffy nose, scratchy throat, watery eyes, rhinorrhea.  Not taking any medication for allergy    Urinary frequency.  Patient complains of urinary frequency worse at night.  No fever, dysuria, hesitancy    Past  Medical History:  Diagnosis Date   Anxiety    Asthma    Chlamydia    Depression    Hypertension    Sleep apnea    Suicidal ideation    Trichomonas infection     Past Surgical History:  Procedure Laterality Date   CESAREAN SECTION     cesearean section      Family History  Problem Relation Age of Onset   Cancer Mother        breast    Social History   Socioeconomic History   Marital status: Single    Spouse name: Not on file   Number of children: 2   Years of education: Not on file   Highest education level: Not on file  Occupational History   Not on file  Tobacco Use   Smoking status: Every Day    Current packs/day: 0.25    Average packs/day: 0.3 packs/day for 15.0 years (3.8 ttl pk-yrs)    Types: Cigarettes   Smokeless tobacco: Never  Substance and Sexual Activity   Alcohol use: Yes    Comment: occasionally    Drug use: No   Sexual activity: Yes    Birth control/protection: None  Other Topics Concern   Not on file  Social History Narrative   Lives in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    Social Drivers of Health   Financial Resource Strain: Not on file  Food Insecurity: Food Insecurity Present (06/05/2023)   Hunger Vital Sign    Worried  About Running Out of Food in the Last Year: Sometimes true    Ran Out of Food in the Last Year: Sometimes true  Transportation Needs: No Transportation Needs (06/05/2023)   PRAPARE - Administrator, Civil Service (Medical): No    Lack of Transportation (Non-Medical): No  Physical Activity: Not on file  Stress: Not on file  Social Connections: Moderately Isolated (06/05/2023)   Social Connection and Isolation Panel [NHANES]    Frequency of Communication with Friends and Family: More than three times a week    Frequency of Social Gatherings with Friends and Family: More than three times a week    Attends Religious Services: More than 4 times per year    Active Member of [employer] or Organizations: No    Attends Tax inspector Meetings: Never    Marital Status: Never married  Intimate Partner Violence: Not At Risk (06/05/2023)   Humiliation, Afraid, Rape, and Kick questionnaire    Fear of Current or Ex-Partner: No    Emotionally Abused: No    Physically Abused: No    Sexually Abused: No    ROS Review of Systems  Constitutional:  Negative for appetite change, chills, fatigue and fever.  HENT:  Negative for congestion, postnasal drip, rhinorrhea and sneezing.   Respiratory:  Positive for cough, shortness of breath and wheezing.   Cardiovascular:  Negative for chest pain, palpitations and leg swelling.  Gastrointestinal:  Negative for abdominal pain, constipation, nausea and vomiting.  Genitourinary:  Negative for difficulty urinating, dysuria, flank pain and frequency.  Musculoskeletal:  Negative for arthralgias, back pain, joint swelling and myalgias.  Skin:  Negative for color change, pallor, rash and wound.  Neurological:  Negative for dizziness, facial asymmetry, weakness, numbness and headaches.  Psychiatric/Behavioral:  Negative for behavioral problems, confusion, self-injury and suicidal ideas.     Objective:   Today's Vitals: BP (!) 147/78   Pulse 91   Temp 98.2 F (36.8 C) (Oral)   Wt (!) 334 lb 6.4 oz (151.7 kg)   SpO2 100%   BMI 57.40 kg/m   Physical Exam Vitals and nursing note reviewed.  Constitutional:      General: She is not in acute distress.    Appearance: Normal appearance. She is obese. She is not ill-appearing, toxic-appearing or diaphoretic.  HENT:     Right Ear: Tympanic membrane, ear canal and external ear normal.     Left Ear: Tympanic membrane, ear canal and external ear normal.     Nose: Congestion present.     Right Turbinates: Enlarged.     Left Turbinates: Enlarged.     Mouth/Throat:     Mouth: Mucous membranes are moist.     Pharynx: Oropharynx is clear. No oropharyngeal exudate or posterior oropharyngeal erythema.  Eyes:     General: No scleral  icterus.       Right eye: No discharge.        Left eye: No discharge.     Extraocular Movements: Extraocular movements intact.     Conjunctiva/sclera: Conjunctivae normal.  Cardiovascular:     Rate and Rhythm: Normal rate and regular rhythm.     Pulses: Normal pulses.     Heart sounds: Normal heart sounds. No murmur heard.    No friction rub. No gallop.  Pulmonary:     Effort: Pulmonary effort is normal. No respiratory distress.     Breath sounds: Normal breath sounds. No stridor. No wheezing, rhonchi or rales.  Chest:  Chest wall: No tenderness.  Abdominal:     General: There is no distension.     Palpations: Abdomen is soft.     Tenderness: There is no abdominal tenderness. There is no right CVA tenderness, left CVA tenderness or guarding.  Musculoskeletal:        General: No swelling, tenderness, deformity or signs of injury.     Right lower leg: No edema.     Left lower leg: No edema.  Skin:    General: Skin is warm and dry.     Capillary Refill: Capillary refill takes less than 2 seconds.     Coloration: Skin is not jaundiced or pale.     Findings: No bruising, erythema or lesion.  Neurological:     Mental Status: She is alert and oriented to person, place, and time.     Motor: No weakness.     Coordination: Coordination normal.     Gait: Gait normal.  Psychiatric:        Mood and Affect: Mood normal.        Behavior: Behavior normal.        Thought Content: Thought content normal.        Judgment: Judgment normal.     Assessment & Plan:   Problem List Items Addressed This Visit       Cardiovascular and Mediastinum   HTN (hypertension)   Encouraged to take amlodipine 10 mg daily as ordered Blood pressure goal is less than 140/90 Encouraged to monitor blood pressure at home keep a log and bring to next visit DASH diet and commitment to daily physical activity for a minimum of 30 minutes discussed and encouraged, as a part of hypertension management. The  importance of attaining a healthy weight is also discussed.     07/14/2023   10:30 AM 07/14/2023   10:04 AM 06/06/2023    4:35 AM 06/05/2023   11:14 PM 06/05/2023    8:39 PM 06/05/2023    7:45 PM 06/05/2023    4:08 PM  BP/Weight  Systolic BP 147 151 140 155 162 184 157  Diastolic BP 78 105 90 90 113 117 97  Wt. (Lbs)  334.4       BMI  57.4 kg/m2                  Respiratory   Lung nodule   Smoking cessation encouraged Patient referred to pulmonology    IMPRESSION: 1. No evidence of pulmonary embolism. 2. Mild anterior right middle lobe atelectasis and/or infiltrate. 3. 11 mm ill-defined right lower lobe pulmonary nodule. Consider one of the following in 3 months for both low-risk and high-risk individuals: (a) repeat chest CT, (b) follow-up PET-CT, or (c) tissue sampling. This recommendation follows the consensus statement: Guidelines for Management of Incidental Pulmonary Nodules Detected on CT Images: From the Fleischner Society 2017; Radiology      Relevant Orders   Ambulatory referral to Pulmonology   OSA (obstructive sleep apnea)    - Home sleep test; Future      Relevant Orders   Home sleep test   Allergic rhinitis    - loratadine (CLARITIN) 10 MG tablet; Take 1 tablet (10 mg total) by mouth daily.  Dispense: 30 tablet; Refill: 4 Avoid allergens      Relevant Medications   loratadine (CLARITIN) 10 MG tablet     Other   Morbid obesity (HCC) - Primary   Wt Readings from Last 3 Encounters:  07/14/23 (!) 334 lb  6.4 oz (151.7 kg)  06/04/23 281 lb (127.5 kg)  04/18/20 281 lb (127.5 kg)   Body mass index is 57.4 kg/m.  Stated that she has been watching her diet and has been trying to do some exercises Patient counseled on low-carb diet Encouraged to engage in regular moderate to vigorous exercises at least  150 minutes weekly as tolerated Benefits of healthy weights discussed      Tobacco use   Smokes about 1 pack/day  Asked about quitting: confirms  that he/she currently smokes cigarettes Advise to quit smoking: Educated about QUITTING to reduce the risk of cancer, cardio and cerebrovascular disease. Assess willingness: willing to quit at this time, and is working on cutting back. Assist with counseling and pharmacotherapy: Counseled for 5 minutes and literature provided. Arrange for follow up: follow up in 2 months and continue to offer help.       Relevant Medications   buPROPion (WELLBUTRIN SR) 150 MG 12 hr tablet   Anxiety and depression      12/03/2017    4:49 PM  GAD 7 : Generalized Anxiety Score  Nervous, Anxious, on Edge 2  Control/stop worrying 2  Worry too much - different things 3  Trouble relaxing 1  Restless 0  Easily annoyed or irritable 0  Afraid - awful might happen 1  Total GAD 7 Score 9      12/03/2017    4:49 PM  GAD 7 : Generalized Anxiety Score  Nervous, Anxious, on Edge 2  Control/stop worrying 2  Worry too much - different things 3  Trouble relaxing 1  Restless 0  Easily annoyed or irritable 0  Afraid - awful might happen 1  Total GAD 7 Score 9  Starting Wellbutrin for smoking cessation Will also treat depression She denies SI,HI           Relevant Medications   buPROPion (WELLBUTRIN SR) 150 MG 12 hr tablet   Shortness of breath   Smoking cessation encouraged - albuterol (VENTOLIN HFA) 108 (90 Base) MCG/ACT inhaler; Inhale 2 puffs into the lungs every 6 (six) hours as needed for wheezing or shortness of breath.  Dispense: 8 g; Refill: 2 - Ambulatory referral to Pulmonology       Relevant Medications   albuterol (VENTOLIN HFA) 108 (90 Base) MCG/ACT inhaler   Other Relevant Orders   Ambulatory referral to Pulmonology   Prediabetes   Lab Results  Component Value Date   HGBA1C 6.1 (A) 07/14/2023  Avoid sugar sweets soda Lose weight      Relevant Orders   POCT glycosylated hemoglobin (Hb A1C) (Completed)   Frequent noctunal urination   Patient encouraged to drink at least 64  ounces of water daily and during the daytime to minimize getting up at bedtime to urinate A1c sugar prediabetes       Outpatient Encounter Medications as of 07/14/2023  Medication Sig   albuterol (VENTOLIN HFA) 108 (90 Base) MCG/ACT inhaler Inhale 2 puffs into the lungs every 6 (six) hours as needed for wheezing or shortness of breath.   amLODipine (NORVASC) 10 MG tablet Take 1 tablet (10 mg total) by mouth daily.   aspirin EC 325 MG tablet Take 325 mg by mouth every 4 (four) hours as needed for moderate pain (pain score 4-6).   buPROPion (WELLBUTRIN SR) 150 MG 12 hr tablet Take  150 mg once daily for 3 days; increase to 150 mg twice daily   chlorpheniramine-HYDROcodone (TUSSIONEX) 10-8 MG/5ML Take 5 mLs by mouth  2 (two) times daily.   loratadine (CLARITIN) 10 MG tablet Take 1 tablet (10 mg total) by mouth daily.   [DISCONTINUED] nicotine (NICODERM CQ - DOSED IN MG/24 HOURS) 21 mg/24hr patch Place 1 patch (21 mg total) onto the skin daily.   pseudoephedrine (SUDAFED) 120 MG 12 hr tablet Take 120 mg by mouth every 12 (twelve) hours as needed for congestion. (Patient not taking: Reported on 07/14/2023)   [DISCONTINUED] Phenylephrine-Pheniramine-DM (THERAFLU COLD & COUGH) 02-09-19 MG PACK Take 1 Dose by mouth daily as needed (Cold/flu). (Patient not taking: Reported on 07/14/2023)   No facility-administered encounter medications on file as of 07/14/2023.    Follow-up: Return in about 4 weeks (around 08/11/2023) for HTN smoking cessation .   [name], FNP

## 2023-07-14 NOTE — Assessment & Plan Note (Signed)
 Encouraged to take amlodipine 10 mg daily as ordered Blood pressure goal is less than 140/90 Encouraged to monitor blood pressure at home keep a log and bring to next visit DASH diet and commitment to daily physical activity for a minimum of 30 minutes discussed and encouraged, as a part of hypertension management. The importance of attaining a healthy weight is also discussed.     07/14/2023   10:30 AM 07/14/2023   10:04 AM 06/06/2023    4:35 AM 06/05/2023   11:14 PM 06/05/2023    8:39 PM 06/05/2023    7:45 PM 06/05/2023    4:08 PM  BP/Weight  Systolic BP 147 151 140 155 162 184 157  Diastolic BP 78 105 90 90 113 117 97  Wt. (Lbs)  334.4       BMI  57.4 kg/m2

## 2023-07-14 NOTE — Assessment & Plan Note (Signed)
-   loratadine (CLARITIN) 10 MG tablet; Take 1 tablet (10 mg total) by mouth daily.  Dispense: 30 tablet; Refill: 4 Avoid allergens

## 2023-07-14 NOTE — Assessment & Plan Note (Signed)
 Lab Results  Component Value Date   HGBA1C 6.1 (A) 07/14/2023  Avoid sugar sweets soda Lose weight

## 2023-07-14 NOTE — Assessment & Plan Note (Addendum)
    12/03/2017    4:49 PM  GAD 7 : Generalized Anxiety Score  Nervous, Anxious, on Edge 2  Control/stop worrying 2  Worry too much - different things 3  Trouble relaxing 1  Restless 0  Easily annoyed or irritable 0  Afraid - awful might happen 1  Total GAD 7 Score 9      12/03/2017    4:49 PM  GAD 7 : Generalized Anxiety Score  Nervous, Anxious, on Edge 2  Control/stop worrying 2  Worry too much - different things 3  Trouble relaxing 1  Restless 0  Easily annoyed or irritable 0  Afraid - awful might happen 1  Total GAD 7 Score 9  Starting Wellbutrin for smoking cessation Will also treat depression She denies SI,HI

## 2023-07-14 NOTE — Assessment & Plan Note (Signed)
-   Home sleep test; Future

## 2023-07-24 ENCOUNTER — Ambulatory Visit: Admitting: Emergency Medicine

## 2023-08-02 DIAGNOSIS — Z419 Encounter for procedure for purposes other than remedying health state, unspecified: Secondary | ICD-10-CM | POA: Diagnosis not present

## 2023-08-06 DIAGNOSIS — H5213 Myopia, bilateral: Secondary | ICD-10-CM | POA: Diagnosis not present

## 2023-08-19 IMAGING — DX DG KNEE COMPLETE 4+V*L*
4 series · 4 of 4 positions shown · non-contrast
Comparison: None.

CLINICAL DATA: Bilateral knee pain for 2 weeks. No reported injury.

EXAM:
LEFT KNEE - COMPLETE 4+ VIEW

[knee ap]
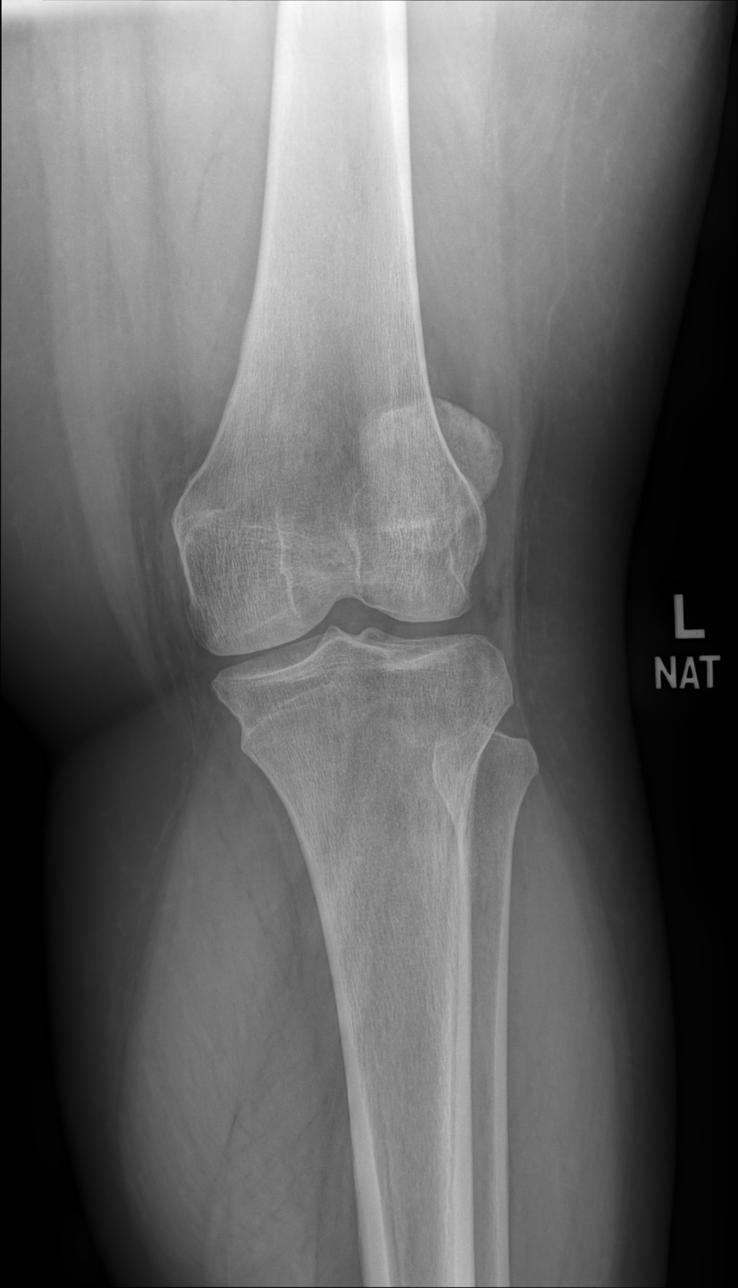

[knee lmo]
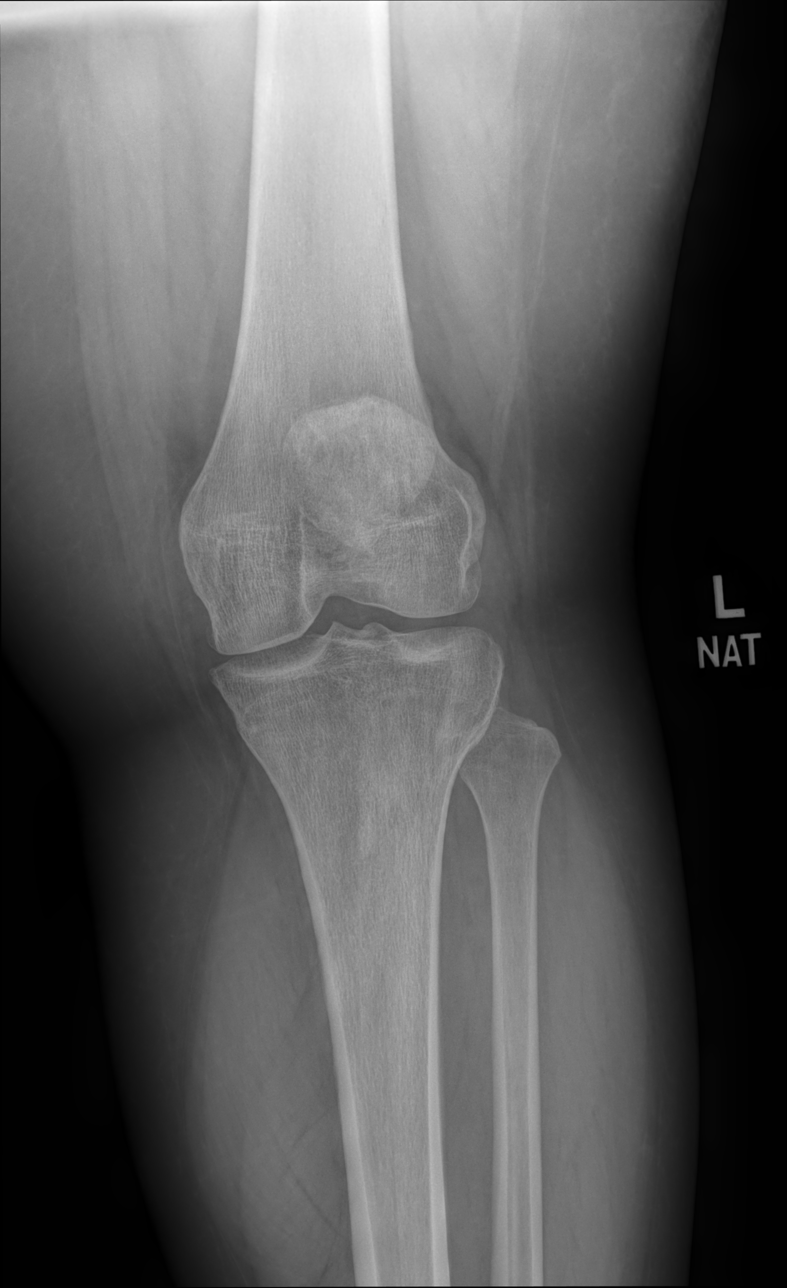

[knee mlo]
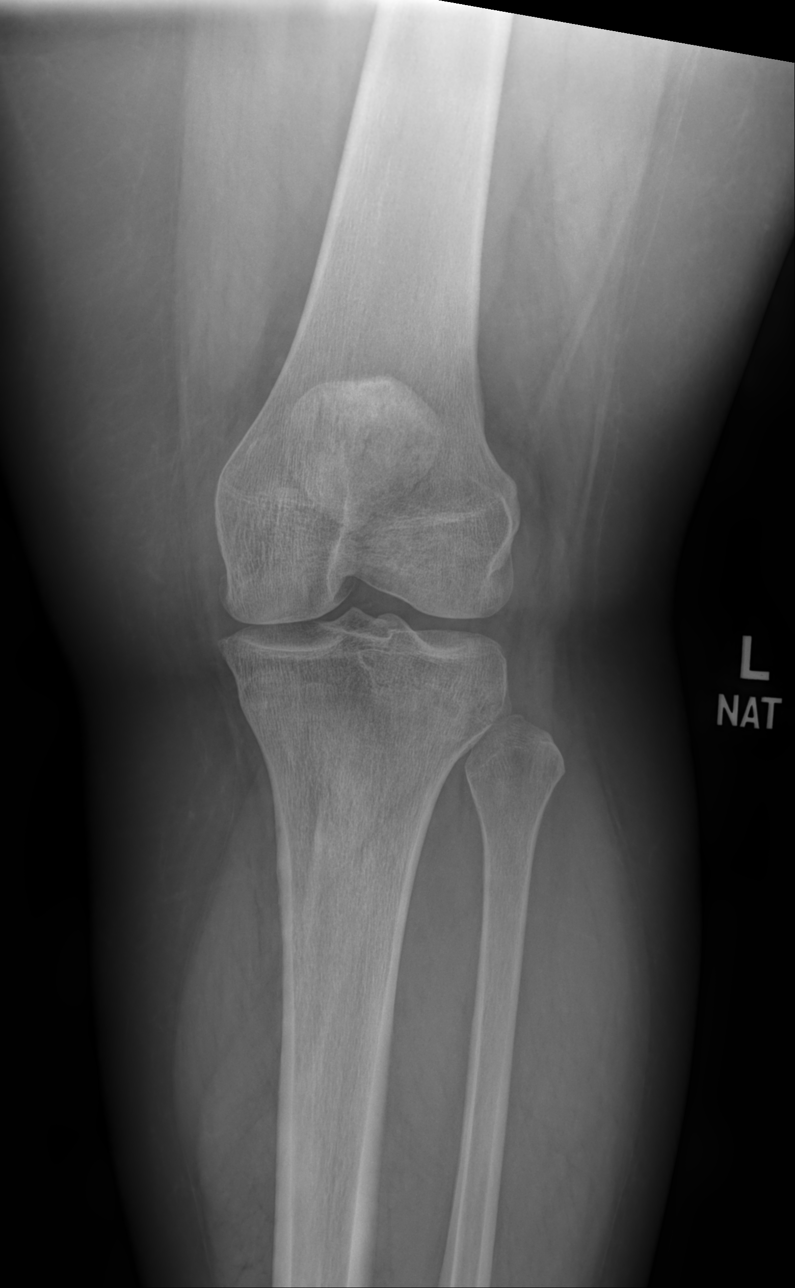

[knee lat]
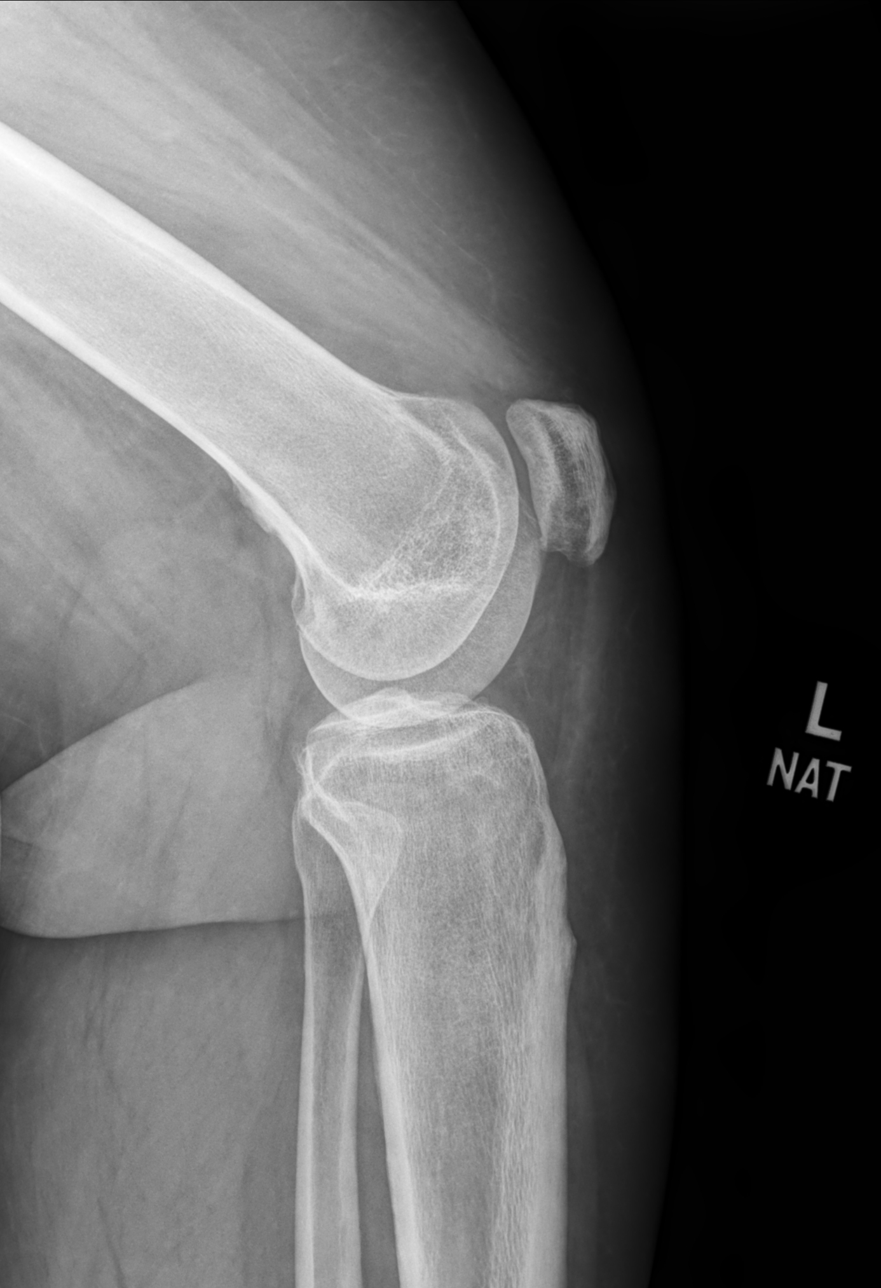

[4 of 4 positions shown; findings below may reference images not displayed]

FINDINGS: No fracture, significant joint effusion or dislocation. No focal
osseous lesions. Minimal medial compartment osteoarthritis.
Preserved lateral and patellofemoral compartments. No radiopaque
foreign bodies.
IMPRESSION: Minimal medial compartment left knee osteoarthritis. No acute
osseous abnormality.

## 2023-08-20 ENCOUNTER — Ambulatory Visit: Payer: Self-pay | Admitting: Nurse Practitioner

## 2023-08-25 ENCOUNTER — Ambulatory Visit (INDEPENDENT_AMBULATORY_CARE_PROVIDER_SITE_OTHER): Payer: Self-pay | Admitting: Nurse Practitioner

## 2023-08-25 ENCOUNTER — Encounter: Payer: Self-pay | Admitting: Nurse Practitioner

## 2023-08-25 VITALS — BP 123/79 | HR 86 | Temp 97.4°F | Wt 331.0 lb

## 2023-08-25 DIAGNOSIS — I10 Essential (primary) hypertension: Secondary | ICD-10-CM

## 2023-08-25 DIAGNOSIS — F419 Anxiety disorder, unspecified: Secondary | ICD-10-CM | POA: Diagnosis not present

## 2023-08-25 DIAGNOSIS — J309 Allergic rhinitis, unspecified: Secondary | ICD-10-CM

## 2023-08-25 DIAGNOSIS — G4733 Obstructive sleep apnea (adult) (pediatric): Secondary | ICD-10-CM | POA: Diagnosis not present

## 2023-08-25 DIAGNOSIS — Z720 Tobacco use: Secondary | ICD-10-CM

## 2023-08-25 DIAGNOSIS — F32A Depression, unspecified: Secondary | ICD-10-CM | POA: Diagnosis not present

## 2023-08-25 DIAGNOSIS — M545 Low back pain, unspecified: Secondary | ICD-10-CM | POA: Diagnosis not present

## 2023-08-25 MED ORDER — NICOTINE 21 MG/24HR TD PT24: 21.0000 mg | MEDICATED_PATCH | Freq: Every day | TRANSDERMAL | 0 refills | Status: DC

## 2023-08-25 MED ORDER — BUSPIRONE HCL 5 MG PO TABS: 5.0000 mg | ORAL_TABLET | Freq: Three times a day (TID) | ORAL | 3 refills | Status: DC

## 2023-08-25 MED ORDER — FLUTICASONE PROPIONATE 50 MCG/ACT NA SUSP: 2.0000 | Freq: Every day | NASAL | 6 refills | Status: DC

## 2023-08-25 MED ORDER — KETOROLAC TROMETHAMINE 30 MG/ML IJ SOLN
30.0000 mg | Freq: Once | INTRAMUSCULAR | Status: AC
  Administered 2023-08-25: 30 mg via INTRAMUSCULAR

## 2023-08-25 MED ORDER — WEGOVY 0.25 MG/0.5ML ~~LOC~~ SOAJ: 0.2500 mg | SUBCUTANEOUS | 0 refills | Status: DC

## 2023-08-25 NOTE — Assessment & Plan Note (Signed)
 Lose weight Take Tylenol  650 mg every 6 hours as needed Toradol  30 mg injection given in the office today Encourage use blood heating pad

## 2023-08-25 NOTE — Assessment & Plan Note (Signed)
 Continue amlodipine  10 mg daily DASH diet and commitment to daily physical activity for a minimum of 30 minutes discussed and encouraged, as a part of hypertension management. The importance of attaining a healthy weight is also discussed.     08/25/2023   11:52 AM 07/14/2023   10:30 AM 07/14/2023   10:04 AM 06/06/2023    4:35 AM 06/05/2023   11:14 PM 06/05/2023    8:39 PM 06/05/2023    7:45 PM  BP/Weight  Systolic BP 123 147 151 140 155 162 184  Diastolic BP 79 78 105 90 90 113 117  Wt. (Lbs) 331  334.4      BMI 56.82 kg/m2  57.4 kg/m2

## 2023-08-25 NOTE — Assessment & Plan Note (Addendum)
    08/25/2023   11:56 AM 07/14/2023   10:23 AM 12/03/2017    4:49 PM  Depression screen PHQ 2/9  Decreased Interest 0 3 3  Down, Depressed, Hopeless 1 3 2   PHQ - 2 Score 1 6 5   Altered sleeping 3 3 3   Tired, decreased energy 3 3 2   Change in appetite 2 3 3   Feeling bad or failure about yourself  0 3 2  Trouble concentrating 1 1 0  Moving slowly or fidgety/restless 0 3 0  Suicidal thoughts 0 0 0  PHQ-9 Score 10 22 15   Difficult doing work/chores Somewhat difficult Not difficult at all        08/25/2023   11:58 AM 12/03/2017    4:49 PM  GAD 7 : Generalized Anxiety Score  Nervous, Anxious, on Edge 0 2  Control/stop worrying 1 2  Worry too much - different things 1 3  Trouble relaxing 3 1  Restless 0 0  Easily annoyed or irritable 1 0  Afraid - awful might happen 1 1  Total GAD 7 Score 7 9  Anxiety Difficulty Not difficult at all   Continue Wellbutrin  150 mg twice daily Start buspirone 5 mg 3 times daily for anxiety Currently denies SI, HI

## 2023-08-25 NOTE — Assessment & Plan Note (Signed)
 Sleep study ordered but has not been done We will follow-up on this

## 2023-08-25 NOTE — Progress Notes (Signed)
 Established Patient Office Visit  Subjective:  Patient ID: Rose Gutierrez, female    DOB: 1980/06/15  Age: 43 y.o. MRN: [id number]  CC:  Chief Complaint  Patient presents with   Hypertension    HPI Rose Gutierrez is a 43 y.o. female  has a past medical history of Anxiety, Asthma, Chlamydia, Depression, Hypertension, Sleep apnea, Suicidal ideation, and Trichomonas infection.  Patient presents for follow-up for her chronic medical conditions  Chronic low back pain.  Patient complains of chronic low back pain that is worse after a lot of walking, she works in a [employer], takes Aleve  as needed that helps some.  No complaints of normal daily.  Her back pain is currently rated 3/10, not taking any medication today  Obesity.  Does a lots of walking at work, states that she is working on her diet.  Interested in medication for obesity  Tobacco use disorder.  Smokes 1 pack of cigarettes daily, has Wellbutrin  ordered but not taking the medication consistently, stated that she feels sleepy at work and thought that Wellbutrin  could be contributing to this.  Currently denies cough, shortness of breath, wheezing.     Past Medical History:  Diagnosis Date   Anxiety    Asthma    Chlamydia    Depression    Hypertension    Sleep apnea    Suicidal ideation    Trichomonas infection     Past Surgical History:  Procedure Laterality Date   CESAREAN SECTION     cesearean section      Family History  Problem Relation Age of Onset   Cancer Mother        breast    Social History   Socioeconomic History   Marital status: Single    Spouse name: Not on file   Number of children: 2   Years of education: Not on file   Highest education level: Not on file  Occupational History   Not on file  Tobacco Use   Smoking status: Every Day    Current packs/day: 0.25    Average packs/day: 0.3 packs/day for 15.0 years (3.8 ttl pk-yrs)    Types: Cigarettes   Smokeless tobacco: Never   Substance and Sexual Activity   Alcohol use: Yes    Comment: occasionally    Drug use: No   Sexual activity: Yes    Birth control/protection: None  Other Topics Concern   Not on file  Social History Narrative   [place]ives in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    Social Drivers of Health   Financial Resource Strain: Not on file  Food Insecurity: Food Insecurity Present (06/05/2023)   Hunger Vital Sign    Worried About Running Out of Food in the Last Year: Sometimes true    Ran Out of Food in the Last Year: Sometimes true  Transportation Needs: No Transportation Needs (06/05/2023)   PRAPARE - Administrator, Civil Service (Medical): No    Lack of Transportation (Non-Medical): No  Physical Activity: Not on file  Stress: Not on file  Social Connections: Moderately Isolated (06/05/2023)   Social Connection and Isolation Panel [NHANES]    Frequency of Communication with Friends and Family: More than three times a week    Frequency of Social Gatherings with Friends and Family: More than three times a week    Attends Religious Services: More than 4 times per year    Active Member of Clubs or Organizations: No    Attends  Club or Organization Meetings: Never    Marital Status: Never married  Intimate Partner Violence: Not At Risk (06/05/2023)   Humiliation, Afraid, Rape, and Kick questionnaire    Fear of Current or Ex-Partner: No    Emotionally Abused: No    Physically Abused: No    Sexually Abused: No    Outpatient Medications Prior to Visit  Medication Sig Dispense Refill   albuterol  (VENTOLIN  HFA) 108 (90 Base) MCG/ACT inhaler Inhale 2 puffs into the lungs every 6 (six) hours as needed for wheezing or shortness of breath. 8 g 2   amLODipine  (NORVASC ) 10 MG tablet Take 1 tablet (10 mg total) by mouth daily. 90 tablet 1   aspirin EC 325 MG tablet Take 325 mg by mouth every 4 (four) hours as needed for moderate pain (pain score 4-6).     buPROPion  (WELLBUTRIN  SR) 150 MG 12 hr tablet  Take  150 mg once daily for 3 days; increase to 150 mg twice daily 60 tablet 3   loratadine  (CLARITIN ) 10 MG tablet Take 1 tablet (10 mg total) by mouth daily. 30 tablet 4   pseudoephedrine (SUDAFED) 120 MG 12 hr tablet Take 120 mg by mouth every 12 (twelve) hours as needed for congestion. (Patient not taking: Reported on 07/14/2023)     chlorpheniramine-HYDROcodone  (TUSSIONEX) 10-8 MG/5ML Take 5 mLs by mouth 2 (two) times daily. (Patient not taking: Reported on 08/25/2023) 70 mL 0   No facility-administered medications prior to visit.    No Known Allergies  ROS Review of Systems  Constitutional:  Negative for appetite change, chills, fatigue and fever.  HENT:  Positive for congestion, postnasal drip, rhinorrhea and sneezing.   Respiratory:  Negative for cough, shortness of breath and wheezing.   Cardiovascular:  Negative for chest pain, palpitations and leg swelling.  Gastrointestinal:  Negative for abdominal pain, constipation, nausea and vomiting.  Genitourinary:  Negative for difficulty urinating, dysuria, flank pain and frequency.  Musculoskeletal:  Positive for arthralgias and back pain. Negative for joint swelling and myalgias.  Skin:  Negative for color change, pallor, rash and wound.  Neurological:  Negative for dizziness, facial asymmetry, weakness, numbness and headaches.  Psychiatric/Behavioral:  Negative for behavioral problems, confusion, self-injury and suicidal ideas.       Objective:    Physical Exam Vitals and nursing note reviewed.  Constitutional:      General: She is not in acute distress.    Appearance: Normal appearance. She is obese. She is not ill-appearing, toxic-appearing or diaphoretic.  HENT:     Nose: Congestion and rhinorrhea present.     Right Turbinates: Enlarged.     Left Turbinates: Enlarged.  Eyes:     General: No scleral icterus.       Right eye: No discharge.        Left eye: No discharge.     Extraocular Movements: Extraocular movements  intact.     Conjunctiva/sclera: Conjunctivae normal.  Cardiovascular:     Rate and Rhythm: Normal rate and regular rhythm.     Pulses: Normal pulses.     Heart sounds: Normal heart sounds. No murmur heard.    No friction rub. No gallop.  Pulmonary:     Effort: Pulmonary effort is normal. No respiratory distress.     Breath sounds: Normal breath sounds. No stridor. No wheezing, rhonchi or rales.  Chest:     Chest wall: No tenderness.  Abdominal:     General: There is no distension.  Palpations: Abdomen is soft.     Tenderness: There is no abdominal tenderness. There is no right CVA tenderness, left CVA tenderness or guarding.  Musculoskeletal:        General: No swelling, tenderness, deformity or signs of injury.     Right lower leg: No edema.     Left lower leg: No edema.  Skin:    General: Skin is warm and dry.     Capillary Refill: Capillary refill takes less than 2 seconds.     Coloration: Skin is not jaundiced or pale.     Findings: No bruising, erythema or lesion.  Neurological:     Mental Status: She is alert and oriented to person, place, and time.     Motor: No weakness.     Coordination: Coordination normal.     Gait: Gait normal.  Psychiatric:        Mood and Affect: Mood normal.        Behavior: Behavior normal.        Thought Content: Thought content normal.        Judgment: Judgment normal.     BP 123/79   Pulse 86   Temp (!) 97.4 F (36.3 C)   Wt (!) 331 lb (150.1 kg)   SpO2 97%   BMI 56.82 kg/m  Wt Readings from Last 3 Encounters:  08/25/23 (!) 331 lb (150.1 kg)  07/14/23 (!) 334 lb 6.4 oz (151.7 kg)  06/04/23 281 lb (127.5 kg)    Lab Results  Component Value Date   TSH 1.870 11/03/2013   Lab Results  Component Value Date   WBC 22.0 (H) 06/06/2023   HGB 10.0 (L) 06/06/2023   HCT 35.1 (L) 06/06/2023   MCV 91.9 06/06/2023   PLT 403 (H) 06/06/2023   Lab Results  Component Value Date   NA 136 06/06/2023   K 4.6 06/06/2023   CO2 25  06/06/2023   GLUCOSE 139 (H) 06/06/2023   BUN 18 06/06/2023   CREATININE 0.63 06/06/2023   BILITOT 0.3 08/26/2014   ALKPHOS 60 08/26/2014   AST 16 08/26/2014   ALT 19 08/26/2014   PROT 7.8 08/26/2014   ALBUMIN 4.0 08/26/2014   CALCIUM 9.1 06/06/2023   ANIONGAP 9 06/06/2023   No results found for: "CHOL" No results found for: "HDL" No results found for: "LDLCALC" No results found for: "TRIG" No results found for: "CHOLHDL" Lab Results  Component Value Date   HGBA1C 6.1 (A) 07/14/2023      Assessment & Plan:   Problem List Items Addressed This Visit       Cardiovascular and Mediastinum   HTN (hypertension)   Continue amlodipine  10 mg daily DASH diet and commitment to daily physical activity for a minimum of 30 minutes discussed and encouraged, as a part of hypertension management. The importance of attaining a healthy weight is also discussed.     08/25/2023   11:52 AM 07/14/2023   10:30 AM 07/14/2023   10:04 AM 06/06/2023    4:35 AM 06/05/2023   11:14 PM 06/05/2023    8:39 PM 06/05/2023    7:45 PM  BP/Weight  Systolic BP 123 147 151 140 155 162 184  Diastolic BP 79 78 105 90 90 113 117  Wt. (Lbs) 331  334.4      BMI 56.82 kg/m2  57.4 kg/m2                 Respiratory   OSA (obstructive sleep apnea)  Sleep study ordered but has not been done We will follow-up on this      Allergic rhinitis   Continue Claritin  10 mg daily Start Flonase  nasal spray 2 spray into both nostrils Avoid allergens, cessation encouraged      Relevant Medications   fluticasone  (FLONASE ) 50 MCG/ACT nasal spray     Other   Morbid obesity (HCC)   Wt Readings from Last 3 Encounters:  08/25/23 (!) 331 lb (150.1 kg)  07/14/23 (!) 334 lb 6.4 oz (151.7 kg)  06/04/23 281 lb (127.5 kg)   Body mass index is 56.82 kg/m.  Patient denies personal or family history of MTC or MEN 2.  They denied personal history of pancreatitis.   Start Wegovy 0.25 mg once weekly Patient encouraged to  avoid fatty fried foods eat smaller portions of meal to help decrease nausea.  Encouraged to report abdominal pain, nausea, vomiting.  Patient counseled on low-carb diet Encouraged to engage in regular moderate to vigorous exercises at least 150 minutes weekly Not interested in bariatric surgery at this time Follow-up in 4 weeks      Relevant Medications   Semaglutide-Weight Management (WEGOVY) 0.25 MG/0.5ML SOAJ   Tobacco use   Smokes about 1 pack/day  Asked about quitting: confirms that he/she currently smokes cigarettes Advise to quit smoking: Educated about QUITTING to reduce the risk of cancer, cardio and cerebrovascular disease. Assess willingness: Unwilling to quit at this time, but is working on cutting back. Assist with counseling and pharmacotherapy: Counseled for 5 minutes and literature provided. Arrange for follow up: follow up in 1 months and continue to offer help.  Start nicotine  patch 21 mg daily      Relevant Medications   nicotine  (NICODERM CQ  - DOSED IN MG/24 HOURS) 21 mg/24hr patch   Anxiety and depression - Primary      08/25/2023   11:56 AM 07/14/2023   10:23 AM 12/03/2017    4:49 PM  Depression screen PHQ 2/9  Decreased Interest 0 3 3  Down, Depressed, Hopeless 1 3 2   PHQ - 2 Score 1 6 5   Altered sleeping 3 3 3   Tired, decreased energy 3 3 2   Change in appetite 2 3 3   Feeling bad or failure about yourself  0 3 2  Trouble concentrating 1 1 0  Moving slowly or fidgety/restless 0 3 0  Suicidal thoughts 0 0 0  PHQ-9 Score 10 22 15   Difficult doing work/chores Somewhat difficult Not difficult at all        08/25/2023   11:58 AM 12/03/2017    4:49 PM  GAD 7 : Generalized Anxiety Score  Nervous, Anxious, on Edge 0 2  Control/stop worrying 1 2  Worry too much - different things 1 3  Trouble relaxing 3 1  Restless 0 0  Easily annoyed or irritable 1 0  Afraid - awful might happen 1 1  Total GAD 7 Score 7 9  Anxiety Difficulty Not difficult at all    Continue Wellbutrin  150 mg twice daily Start buspirone 5 mg 3 times daily for anxiety Currently denies SI, HI           Relevant Medications   busPIRone (BUSPAR) 5 MG tablet   Acute midline low back pain   Lose weight Take Tylenol  650 mg every 6 hours as needed Toradol  30 mg injection given in the office today Encourage use blood heating pad       Meds ordered this encounter  Medications  fluticasone  (FLONASE ) 50 MCG/ACT nasal spray    Sig: Place 2 sprays into both nostrils daily.    Dispense:  16 g    Refill:  6   busPIRone (BUSPAR) 5 MG tablet    Sig: Take 1 tablet (5 mg total) by mouth 3 (three) times daily.    Dispense:  90 tablet    Refill:  3   Semaglutide-Weight Management (WEGOVY) 0.25 MG/0.5ML SOAJ    Sig: Inject 0.25 mg into the skin once a week.    Dispense:  2 mL    Refill:  0   nicotine  (NICODERM CQ  - DOSED IN MG/24 HOURS) 21 mg/24hr patch    Sig: Place 1 patch (21 mg total) onto the skin daily.    Dispense:  42 patch    Refill:  0   ketorolac  (TORADOL ) 30 MG/ML injection 30 mg    Follow-up: Return in about 4 weeks (around 09/22/2023), or obesity.    Vivica Dobosz R Shadana Pry, FNP

## 2023-08-25 NOTE — Assessment & Plan Note (Addendum)
 Smokes about 1 pack/day  Asked about quitting: confirms that he/she currently smokes cigarettes Advise to quit smoking: Educated about QUITTING to reduce the risk of cancer, cardio and cerebrovascular disease. Assess willingness: Unwilling to quit at this time, but is working on cutting back. Assist with counseling and pharmacotherapy: Counseled for 5 minutes and literature provided. Arrange for follow up: follow up in 1 months and continue to offer help.  Start nicotine  patch 21 mg daily

## 2023-08-25 NOTE — Assessment & Plan Note (Addendum)
 Wt Readings from Last 3 Encounters:  08/25/23 (!) 331 lb (150.1 kg)  07/14/23 (!) 334 lb 6.4 oz (151.7 kg)  06/04/23 281 lb (127.5 kg)   Body mass index is 56.82 kg/m.  Patient denies personal or family history of MTC or MEN 2.  They denied personal history of pancreatitis.   Start Wegovy 0.25 mg once weekly Patient encouraged to avoid fatty fried foods eat smaller portions of meal to help decrease nausea.  Encouraged to report abdominal pain, nausea, vomiting.  Patient counseled on low-carb diet Encouraged to engage in regular moderate to vigorous exercises at least 150 minutes weekly Not interested in bariatric surgery at this time Follow-up in 4 weeks

## 2023-08-25 NOTE — Assessment & Plan Note (Signed)
 Continue Claritin  10 mg daily Start Flonase  nasal spray 2 spray into both nostrils Avoid allergens, cessation encouraged

## 2023-08-25 NOTE — Patient Instructions (Signed)
 Follow back pain please take Tylenol  650 mg every 6 hours as needed.  I encourage use of heat pad or ice as needed. You were given toradol  30mg  injection in the office today    1. Anxiety and depression (Primary)  - busPIRone (BUSPAR) 5 MG tablet; Take 1 tablet (5 mg total) by mouth 3 (three) times daily.  Dispense: 90 tablet; Refill: 3  2. Tobacco use  - nicotine  (NICODERM CQ  - DOSED IN MG/24 HOURS) 21 mg/24hr patch; Place 1 patch (21 mg total) onto the skin daily.  Dispense: 42 patch; Refill: 0  3. OSA (obstructive sleep apnea)   4. Morbid obesity (HCC)  - Semaglutide-Weight Management (WEGOVY) 0.25 MG/0.5ML SOAJ; Inject 0.25 mg into the skin once a week.  Dispense: 2 mL; Refill: 0  5. Allergic rhinitis, unspecified seasonality, unspecified trigger  - fluticasone  (FLONASE ) 50 MCG/ACT nasal spray; Place 2 sprays into both nostrils daily.  Dispense: 16 g; Refill: 6  6. Acute midline low back pain, unspecified whether sciatica present    It is important that you exercise regularly at least 30 minutes 5 times a week as tolerated  Think about what you will eat, plan ahead. Choose " clean, green, fresh or frozen" over canned, processed or packaged foods which are more sugary, salty and fatty. 70 to 75% of food eaten should be vegetables and fruit. Three meals at set times with snacks allowed between meals, but they must be fruit or vegetables. Aim to eat over a 12 hour period , example 7 am to 7 pm, and STOP after  your last meal of the day. Drink water,generally about 64 ounces per day, no other drink is as healthy. Fruit juice is best enjoyed in a healthy way, by EATING the fruit.  Thanks for choosing [facility] we consider it a privelige to serve you.

## 2023-08-26 ENCOUNTER — Other Ambulatory Visit: Payer: Self-pay

## 2023-08-26 ENCOUNTER — Telehealth: Payer: Self-pay

## 2023-08-26 NOTE — Telephone Encounter (Signed)
 Pharmacy Patient Advocate Encounter   Received notification from CoverMyMeds that prior authorization for [facility] is required/requested.   Insurance verification completed.   The patient is insured through [facility] [place] .   Per test claim: PA required; PA submitted to above mentioned insurance via CoverMyMeds Key/confirmation #/EOC [id number] Status is pending

## 2023-08-28 ENCOUNTER — Other Ambulatory Visit: Payer: Self-pay

## 2023-09-01 ENCOUNTER — Other Ambulatory Visit: Payer: Self-pay

## 2023-09-01 DIAGNOSIS — Z419 Encounter for procedure for purposes other than remedying health state, unspecified: Secondary | ICD-10-CM | POA: Diagnosis not present

## 2023-09-04 ENCOUNTER — Telehealth

## 2023-09-04 ENCOUNTER — Encounter: Payer: Self-pay | Admitting: Internal Medicine

## 2023-09-04 NOTE — Telephone Encounter (Signed)
Pt was made an appt. Kh

## 2023-09-05 ENCOUNTER — Other Ambulatory Visit: Payer: Self-pay

## 2023-09-05 ENCOUNTER — Encounter: Payer: Self-pay | Admitting: Nurse Practitioner

## 2023-09-05 ENCOUNTER — Emergency Department (HOSPITAL_COMMUNITY)
Admission: EM | Admit: 2023-09-05 | Discharge: 2023-09-05 | Disposition: A | Discharge status: Home / Self Care | Attending: Emergency Medicine | Admitting: Emergency Medicine

## 2023-09-05 ENCOUNTER — Ambulatory Visit: Payer: Self-pay | Admitting: Nurse Practitioner

## 2023-09-05 ENCOUNTER — Encounter (HOSPITAL_COMMUNITY): Payer: Self-pay | Admitting: *Deleted

## 2023-09-05 ENCOUNTER — Emergency Department (HOSPITAL_COMMUNITY)

## 2023-09-05 VITALS — BP 116/74 | HR 95 | Temp 97.6°F | Wt 338.0 lb

## 2023-09-05 DIAGNOSIS — R079 Chest pain, unspecified: Secondary | ICD-10-CM | POA: Diagnosis not present

## 2023-09-05 DIAGNOSIS — I10 Essential (primary) hypertension: Secondary | ICD-10-CM | POA: Insufficient documentation

## 2023-09-05 DIAGNOSIS — Z720 Tobacco use: Secondary | ICD-10-CM | POA: Diagnosis not present

## 2023-09-05 DIAGNOSIS — R0602 Shortness of breath: Secondary | ICD-10-CM | POA: Diagnosis not present

## 2023-09-05 DIAGNOSIS — Z7951 Long term (current) use of inhaled steroids: Secondary | ICD-10-CM | POA: Insufficient documentation

## 2023-09-05 DIAGNOSIS — Z79899 Other long term (current) drug therapy: Secondary | ICD-10-CM | POA: Diagnosis not present

## 2023-09-05 DIAGNOSIS — R0789 Other chest pain: Secondary | ICD-10-CM | POA: Diagnosis not present

## 2023-09-05 DIAGNOSIS — F17200 Nicotine dependence, unspecified, uncomplicated: Secondary | ICD-10-CM | POA: Insufficient documentation

## 2023-09-05 DIAGNOSIS — J069 Acute upper respiratory infection, unspecified: Secondary | ICD-10-CM | POA: Insufficient documentation

## 2023-09-05 DIAGNOSIS — J45909 Unspecified asthma, uncomplicated: Secondary | ICD-10-CM | POA: Insufficient documentation

## 2023-09-05 DIAGNOSIS — R0989 Other specified symptoms and signs involving the circulatory and respiratory systems: Secondary | ICD-10-CM | POA: Diagnosis not present

## 2023-09-05 DIAGNOSIS — Z7982 Long term (current) use of aspirin: Secondary | ICD-10-CM | POA: Insufficient documentation

## 2023-09-05 DIAGNOSIS — R9389 Abnormal findings on diagnostic imaging of other specified body structures: Secondary | ICD-10-CM | POA: Diagnosis not present

## 2023-09-05 DIAGNOSIS — R059 Cough, unspecified: Secondary | ICD-10-CM | POA: Diagnosis present

## 2023-09-05 LAB — CBC WITH DIFFERENTIAL/PLATELET
Abs Immature Granulocytes: 0.03 10*3/uL (ref 0.00–0.07)
Basophils Absolute: 0.1 10*3/uL (ref 0.0–0.1)
Basophils Relative: 1 %
Eosinophils Absolute: 0.1 10*3/uL (ref 0.0–0.5)
Eosinophils Relative: 1 %
HCT: 36.8 % (ref 36.0–46.0)
Hemoglobin: 11 g/dL — ABNORMAL LOW (ref 12.0–15.0)
Immature Granulocytes: 0 %
Lymphocytes Relative: 32 %
Lymphs Abs: 2.4 10*3/uL (ref 0.7–4.0)
MCH: 26.9 pg (ref 26.0–34.0)
MCHC: 29.9 g/dL — ABNORMAL LOW (ref 30.0–36.0)
MCV: 90 fL (ref 80.0–100.0)
Monocytes Absolute: 0.8 10*3/uL (ref 0.1–1.0)
Monocytes Relative: 11 %
Neutro Abs: 4.2 10*3/uL (ref 1.7–7.7)
Neutrophils Relative %: 55 %
Platelets: 389 10*3/uL (ref 150–400)
RBC: 4.09 MIL/uL (ref 3.87–5.11)
RDW: 15.3 % (ref 11.5–15.5)
WBC: 7.6 10*3/uL (ref 4.0–10.5)
nRBC: 0 % (ref 0.0–0.2)

## 2023-09-05 LAB — BASIC METABOLIC PANEL WITH GFR
Anion gap: 9 (ref 5–15)
BUN: 11 mg/dL (ref 6–20)
CO2: 26 mmol/L (ref 22–32)
Calcium: 8.8 mg/dL — ABNORMAL LOW (ref 8.9–10.3)
Chloride: 101 mmol/L (ref 98–111)
Creatinine, Ser: 0.79 mg/dL (ref 0.44–1.00)
GFR, Estimated: >60 mL/min (ref >60)
Glucose, Bld: 93 mg/dL (ref 70–99)
Potassium: 3.7 mmol/L (ref 3.5–5.1)
Sodium: 136 mmol/L (ref 135–145)

## 2023-09-05 MED ORDER — PREDNISONE 10 MG PO TABS
20.0000 mg | ORAL_TABLET | Freq: Every day | ORAL | 0 refills | Status: AC
Start: 2023-09-05 — End: 2023-09-09

## 2023-09-05 MED ORDER — IPRATROPIUM-ALBUTEROL 0.5-2.5 (3) MG/3ML IN SOLN
3.0000 mL | Freq: Once | RESPIRATORY_TRACT | Status: AC
  Administered 2023-09-05: 3 mL via RESPIRATORY_TRACT
  Filled 2023-09-05: qty 3

## 2023-09-05 MED ORDER — GUAIFENESIN-DM 100-10 MG/5ML PO SYRP: 5.0000 mL | ORAL_SOLUTION | Freq: Three times a day (TID) | ORAL | 0 refills | Status: DC | PRN

## 2023-09-05 NOTE — Patient Instructions (Signed)

## 2023-09-05 NOTE — Assessment & Plan Note (Signed)
 Patient taken to the emergency room for further evaluation

## 2023-09-05 NOTE — Assessment & Plan Note (Signed)
 Cessation encouraged

## 2023-09-05 NOTE — Progress Notes (Signed)
 Acute Office Visit  Subjective:     Patient ID: Rose Gutierrez, female    DOB: 1981-02-19, 43 y.o.   MRN: [id number]  Chief Complaint  Patient presents with   Chest Pain    Sob    HPI Rose Gutierrez  has a past medical history of Anxiety, Asthma, Chlamydia, Depression, Hypertension, Sleep apnea, Suicidal ideation, and Trichomonas infection. Patient is in today for complaints of shortness of breath, dry cough, wheezing sneezing, midsternal chest pain that occurs with coughing, swallowing.  Symptoms started 2 nights ago and has not changed.  Stated that the symptoms are similar to the symptoms she had prior to her last hospitalization.  Taking home medication as ordered    Review of Systems  Constitutional:  Negative for appetite change, chills, fatigue and fever.  HENT:  Positive for congestion and sneezing. Negative for postnasal drip and rhinorrhea.   Respiratory:  Positive for cough and shortness of breath. Negative for wheezing.   Cardiovascular:  Negative for chest pain, palpitations and leg swelling.  Gastrointestinal:  Negative for abdominal pain, constipation, nausea and vomiting.  Genitourinary:  Negative for difficulty urinating, dysuria, flank pain and frequency.  Musculoskeletal:  Negative for arthralgias, back pain, joint swelling and myalgias.  Skin:  Negative for color change, pallor, rash and wound.  Neurological:  Negative for dizziness, facial asymmetry, weakness, numbness and headaches.  Psychiatric/Behavioral:  Negative for behavioral problems, confusion, self-injury and suicidal ideas.         Objective:    BP 116/74   Pulse 95   Temp 97.6 F (36.4 C)   Wt (!) 338 lb (153.3 kg)   LMP  (LMP Unknown)   SpO2 100%   BMI 58.02 kg/m    Physical Exam Vitals reviewed.  Constitutional:      General: She is not in acute distress.    Appearance: She is obese. She is ill-appearing. She is not toxic-appearing or diaphoretic.  HENT:     Nose: Congestion  present. No rhinorrhea.  Eyes:     General: No scleral icterus.       Right eye: No discharge.        Left eye: No discharge.     Extraocular Movements: Extraocular movements intact.     Conjunctiva/sclera: Conjunctivae normal.  Cardiovascular:     Rate and Rhythm: Normal rate and regular rhythm.     Pulses: Normal pulses.     Heart sounds: Normal heart sounds. No murmur heard. Pulmonary:     Effort: Pulmonary effort is normal. No respiratory distress.     Breath sounds: Normal breath sounds. No stridor. No wheezing, rhonchi or rales.  Chest:     Chest wall: No tenderness.  Abdominal:     General: There is no distension.     Palpations: Abdomen is soft.     Tenderness: There is no abdominal tenderness. There is no guarding.  Musculoskeletal:        General: No tenderness.     Cervical back: Normal range of motion and neck supple. No rigidity or tenderness.     Right lower leg: No edema.     Left lower leg: No edema.  Lymphadenopathy:     Cervical: No cervical adenopathy.  Skin:    General: Skin is warm and dry.     Capillary Refill: Capillary refill takes less than 2 seconds.  Neurological:     Mental Status: She is alert and oriented to person, place, and time.  Motor: No weakness.     Coordination: Coordination normal.     Gait: Gait normal.  Psychiatric:        Mood and Affect: Mood normal.        Behavior: Behavior normal.        Thought Content: Thought content normal.        Judgment: Judgment normal.     Results for orders placed or performed during the hospital encounter of 09/05/23  Basic metabolic panel  Result Value Ref Range   Sodium 136 135 - 145 mmol/L   Potassium 3.7 3.5 - 5.1 mmol/L   Chloride 101 98 - 111 mmol/L   CO2 26 22 - 32 mmol/L   Glucose, Bld 93 70 - 99 mg/dL   BUN 11 6 - 20 mg/dL   Creatinine, Ser 1.61 0.44 - 1.00 mg/dL   Calcium 8.8 (L) 8.9 - 10.3 mg/dL   GFR, Estimated >09 >60 mL/min   Anion gap 9 5 - 15  CBC with Differential   Result Value Ref Range   WBC 7.6 4.0 - 10.5 K/uL   RBC 4.09 3.87 - 5.11 MIL/uL   Hemoglobin 11.0 (L) 12.0 - 15.0 g/dL   HCT 45.4 09.8 - 11.9 %   MCV 90.0 80.0 - 100.0 fL   MCH 26.9 26.0 - 34.0 pg   MCHC 29.9 (L) 30.0 - 36.0 g/dL   RDW 14.7 82.9 - 56.2 %   Platelets 389 150 - 400 K/uL   nRBC 0.0 0.0 - 0.2 %   Neutrophils Relative % 55 %   Neutro Abs 4.2 1.7 - 7.7 K/uL   Lymphocytes Relative 32 %   Lymphs Abs 2.4 0.7 - 4.0 K/uL   Monocytes Relative 11 %   Monocytes Absolute 0.8 0.1 - 1.0 K/uL   Eosinophils Relative 1 %   Eosinophils Absolute 0.1 0.0 - 0.5 K/uL   Basophils Relative 1 %   Basophils Absolute 0.1 0.0 - 0.1 K/uL   Immature Granulocytes 0 %   Abs Immature Granulocytes 0.03 0.00 - 0.07 K/uL        Assessment & Plan:   Problem List Items Addressed This Visit       Other   Tobacco use   Cessation encouraged      Shortness of breath   Patient taken to the emergency room for further evaluation      Chest pain - Primary   Patient taken to the emergency room for further evaluation       No orders of the defined types were placed in this encounter.   No follow-ups on file.  Ariadne Rissmiller R Alexandr Yaworski, FNP

## 2023-09-05 NOTE — ED Triage Notes (Signed)
 Here by POV from home for CP and sob. Also endorses cough and sneezing. Pain worse with cough, sneezing, swallowing, eating and drinking. No meds PTA. Rates 6/10. No sick contacts. Denies other sx. Smoker. BP elevated. Alert, NAD, calm, interactive.

## 2023-09-05 NOTE — Discharge Instructions (Signed)
 Follow-up with pulmonology next week as planned.  No pneumonia showed up on the x-ray.  Take the cough medicine as needed.  The steroids and inhaler should hopefully help with the breathing.  Return for worsening symptoms.

## 2023-09-05 NOTE — ED Provider Notes (Signed)
  EMERGENCY DEPARTMENT AT [facility] Provider Note   CSN: [id number] Arrival date & time: 09/05/23  1019     History  Chief Complaint  Patient presents with   Chest Pain    Rose Gutierrez is a 43 y.o. female.   Chest Pain Patient with chest pain shortness of breath.  Is had for the last few days.  Has had cough and sneeze.  Did have admission to the hospital around 3 months ago for URI symptoms.  Lung nodule found.  States was doing better and began to feel worse again.  No definite sick contacts.  Is a smoker but has been cutting back.  Has an appointment to see pulmonology on Thursday.    Past Medical History:  Diagnosis Date   Anxiety    Asthma    Chlamydia    Depression    Hypertension    Sleep apnea    Suicidal ideation    Trichomonas infection     Home Medications Prior to Admission medications   Medication Sig Start Date End Date Taking? Authorizing Provider  guaiFENesin -dextromethorphan (ROBITUSSIN DM) 100-10 MG/5ML syrup Take 5 mLs by mouth 3 (three) times daily as needed for cough. 09/05/23  Yes Mozell Arias, MD  predniSONE  (DELTASONE ) 10 MG tablet Take 2 tablets (20 mg total) by mouth daily for 4 days. 09/05/23 09/09/23 Yes Mozell Arias, MD  albuterol  (VENTOLIN  HFA) 108 [id number] Base) MCG/ACT inhaler Inhale 2 puffs into the lungs every 6 (six) hours as needed for wheezing or shortness of breath. 07/14/23   Paseda, Folashade R, FNP  amLODipine  (NORVASC ) 10 MG tablet Take 1 tablet (10 mg total) by mouth daily. 06/07/23   Faith Homes, MD  aspirin EC 325 MG tablet Take 325 mg by mouth every 4 (four) hours as needed for moderate pain (pain score 4-6).    [provider]  buPROPion  (WELLBUTRIN  SR) 150 MG 12 hr tablet Take  150 mg once daily for 3 days; increase to 150 mg twice daily 07/14/23   Paseda, Folashade R, FNP  busPIRone  (BUSPAR ) 5 MG tablet Take 1 tablet (5 mg total) by mouth 3 (three) times daily. 08/25/23   Paseda, Folashade R,  FNP  fluticasone  (FLONASE ) 50 MCG/ACT nasal spray Place 2 sprays into both nostrils daily. 08/25/23   Paseda, Folashade R, FNP  loratadine  (CLARITIN ) 10 MG tablet Take 1 tablet (10 mg total) by mouth daily. 07/14/23   Paseda, Folashade R, FNP  nicotine  (NICODERM CQ  - DOSED IN MG/24 HOURS) 21 mg/24hr patch Place 1 patch (21 mg total) onto the skin daily. Patient not taking: Reported on 09/05/2023 08/25/23   Paseda, Folashade R, FNP  pseudoephedrine (SUDAFED) 120 MG 12 hr tablet Take 120 mg by mouth every 12 (twelve) hours as needed for congestion. Patient not taking: Reported on 07/14/2023    [provider]  Semaglutide -Weight Management (WEGOVY ) 0.25 MG/0.5ML SOAJ Inject 0.25 mg into the skin once a week. Patient not taking: Reported on 09/05/2023 08/25/23   Paseda, Folashade R, FNP      Allergies    Patient has no known allergies.    Review of Systems   Review of Systems  Cardiovascular:  Positive for chest pain.    Physical Exam Updated Vital Signs BP (!) 148/94 (BP Location: Right Arm)   Pulse 83   Temp 98.3 F (36.8 C) (Oral)   Resp 20   Wt (!) 153.3 kg   LMP  (LMP Unknown)   SpO2 97%  BMI 58.02 kg/m  Physical Exam Vitals and nursing note reviewed.  HENT:     Head: Atraumatic.  Cardiovascular:     Rate and Rhythm: Regular rhythm.  Pulmonary:     Comments: Mildly harsh breath sounds without focal rales or rhonchi. Chest:     Chest wall: No tenderness.  Musculoskeletal:     Right lower leg: No edema.     Left lower leg: No edema.  Neurological:     Mental Status: She is alert.     ED Results / Procedures / Treatments   Labs (all labs ordered are listed, but only abnormal results are displayed) Labs Reviewed  BASIC METABOLIC PANEL WITH GFR - Abnormal; Notable for the following components:      Result Value   Calcium 8.8 (*)    All other components within normal limits  CBC WITH DIFFERENTIAL/PLATELET - Abnormal; Notable for the following components:    Hemoglobin 11.0 (*)    MCHC 29.9 (*)    All other components within normal limits    EKG EKG Interpretation Date/Time:  Friday Sep 05 2023 11:37:31 EDT Ventricular Rate:  84 PR Interval:  97 QRS Duration:  93 QT Interval:  354 QTC Calculation: 419 R Axis:   50  Text Interpretation: Sinus or ectopic atrial rhythm Short PR interval Low voltage, precordial leads Artifact in lead(s) I II III aVR aVL aVF Confirmed by Mozell Arias [phone]) on 09/05/2023 11:43:12 AM  Radiology DG Chest Portable 1 View Result Date: 09/05/2023 CLINICAL DATA:  Chest pain and shortness of breath. EXAM: PORTABLE CHEST 1 VIEW COMPARISON:  06/04/2023. FINDINGS: Low lung volume. Bilateral lung fields are clear. Bilateral costophrenic angles are clear. Note is made of elevated right hemidiaphragm. Stable cardio-mediastinal silhouette. No acute osseous abnormalities. The soft tissues are within normal limits. IMPRESSION: No active disease. Electronically Signed   By: Beula Brunswick M.D.   On: 09/05/2023 11:20    Procedures Procedures    Medications Ordered in ED Medications  ipratropium-albuterol  (DUONEB) 0.5-2.5 (3) MG/3ML nebulizer solution 3 mL (3 mLs Nebulization Given 09/05/23 1126)    ED Course/ Medical Decision Making/ A&P                                 Medical Decision Making Amount and/or Complexity of Data Reviewed Labs: ordered. Radiology: ordered.  Risk OTC drugs. Prescription drug management.  Patient with URI symptoms and cough.  Reviewed recent discharge note.  Most likely infectious versus potentially COPD.  Will get x-ray to evaluate.  Did have recent nodule also.  Will get EKG.  Also basic blood work.   X-ray reassuring.  Blood work reassuring  Ambulated without hypoxia although did have some coughing.  Will treat symptomatically.  May have a component of COPD or asthma.  Will give steroids.  Has pulmonary follow-up next week.  Will discharge home.        Final Clinical  Impression(s) / ED Diagnoses Final diagnoses:  Upper respiratory tract infection, unspecified type    Rx / DC Orders ED Discharge Orders          Ordered    guaiFENesin -dextromethorphan (ROBITUSSIN DM) 100-10 MG/5ML syrup  3 times daily PRN        09/05/23 1400    predniSONE  (DELTASONE ) 10 MG tablet  Daily        09/05/23 1400  Mozell Arias, MD 09/05/23 [phone]

## 2023-09-08 ENCOUNTER — Other Ambulatory Visit: Payer: Self-pay

## 2023-09-09 ENCOUNTER — Telehealth: Payer: Self-pay

## 2023-09-09 NOTE — Transitions of Care (Post Inpatient/ED Visit) (Signed)
 09/09/2023  Name: Mieko Kneebone MRN: [id number] DOB: 10/05/1980  Today's TOC FU Call Status:   Patient's Name and Date of Birth confirmed.  Transition Care Management Follow-up Telephone Call Date of Discharge: 09/05/23 Discharge Facility: [facility]) Type of Discharge: Emergency Department Reason for ED Visit: Respiratory How have you been since you were released from the hospital?: Better Any questions or concerns?: No  Items Reviewed: Did you receive and understand the discharge instructions provided?: Yes Medications obtained,verified, and reconciled?: Yes (Medications Reviewed) Any new allergies since your discharge?: No Dietary orders reviewed?: No Do you have support at home?: No  Medications Reviewed Today: Medications Reviewed Today     Reviewed by Angelita Bares, CMA (Certified Medical Assistant) on 09/09/23 at 1139  Med List Status: <None>   Medication Order Taking? Sig Documenting Provider Last Dose Status Informant  albuterol  (VENTOLIN  HFA) 108 (90 Base) MCG/ACT inhaler [id number] Yes Inhale 2 puffs into the lungs every 6 (six) hours as needed for wheezing or shortness of breath. Paseda, Folashade R, FNP Taking Active   amLODipine  (NORVASC ) 10 MG tablet [id number] Yes Take 1 tablet (10 mg total) by mouth daily. Faith Homes, MD Taking Active   aspirin EC 325 MG tablet [id number] Yes Take 325 mg by mouth every 4 (four) hours as needed for moderate pain (pain score 4-6). [provider] Taking Active Self, Pharmacy Records  buPROPion  (WELLBUTRIN  SR) 150 MG 12 hr tablet [id number] Yes Take  150 mg once daily for 3 days; increase to 150 mg twice daily Paseda, Folashade R, FNP Taking Active   busPIRone  (BUSPAR ) 5 MG tablet [id number] Yes Take 1 tablet (5 mg total) by mouth 3 (three) times daily. Paseda, Folashade R, FNP Taking Active   fluticasone  (FLONASE ) 50 MCG/ACT nasal spray [id number] Yes Place 2 sprays into both nostrils daily. Paseda, Folashade R,  FNP Taking Active   guaiFENesin -dextromethorphan (ROBITUSSIN DM) 100-10 MG/5ML syrup [id number] Yes Take 5 mLs by mouth 3 (three) times daily as needed for cough. Mozell Arias, MD Taking Active   loratadine  (CLARITIN ) 10 MG tablet [id number] Yes Take 1 tablet (10 mg total) by mouth daily. Paseda, Folashade R, FNP Taking Active   nicotine  (NICODERM CQ  - DOSED IN MG/24 HOURS) 21 mg/24hr patch [id number]  Place 1 patch (21 mg total) onto the skin daily.  Patient not taking: Reported on 09/05/2023   Paseda, Folashade R, FNP  Active   predniSONE  (DELTASONE ) 10 MG tablet [id number] Yes Take 2 tablets (20 mg total) by mouth daily for 4 days. Mozell Arias, MD Taking Active   pseudoephedrine (SUDAFED) 120 MG 12 hr tablet [id number]  Take 120 mg by mouth every 12 (twelve) hours as needed for congestion.  Patient not taking: Reported on 07/14/2023   [provider]  Active Self, Pharmacy Records  Semaglutide -Weight Management (WEGOVY ) 0.25 MG/0.5ML SOAJ [id number]  Inject 0.25 mg into the skin once a week.  Patient not taking: Reported on 09/05/2023   Paseda, Folashade R, FNP  Active             Home Care and Equipment/Supplies: Were Home Health Services Ordered?: No Any new equipment or medical supplies ordered?: No  Functional Questionnaire: Do you need assistance with bathing/showering or dressing?: No Do you need assistance with meal preparation?: No Do you need assistance with eating?: No Do you have difficulty maintaining continence: No Do you need assistance with getting out of bed/getting out of a chair/moving?: No Do you have difficulty managing or taking your  medications?: No  Follow up appointments reviewed: PCP Follow-up appointment confirmed?: Yes Date of PCP follow-up appointment?: 09/12/23 Follow-up Provider: Abbey Hobby Specialist [facility] Follow-up appointment confirmed?: NA Do you need transportation to your follow-up appointment?: No Do you understand care  options if your condition(s) worsen?: Yes-patient verbalized understanding    SIGNATURE Ladarrious Kirksey, RMA

## 2023-09-10 ENCOUNTER — Ambulatory Visit (HOSPITAL_BASED_OUTPATIENT_CLINIC_OR_DEPARTMENT_OTHER): Attending: Nurse Practitioner | Admitting: Internal Medicine

## 2023-09-10 DIAGNOSIS — G4733 Obstructive sleep apnea (adult) (pediatric): Secondary | ICD-10-CM | POA: Diagnosis not present

## 2023-09-10 DIAGNOSIS — G4736 Sleep related hypoventilation in conditions classified elsewhere: Secondary | ICD-10-CM | POA: Diagnosis not present

## 2023-09-11 ENCOUNTER — Encounter: Payer: Self-pay | Admitting: Internal Medicine

## 2023-09-11 ENCOUNTER — Ambulatory Visit: Admitting: Internal Medicine

## 2023-09-11 ENCOUNTER — Other Ambulatory Visit (HOSPITAL_COMMUNITY): Payer: Self-pay

## 2023-09-11 VITALS — BP 143/93 | HR 93 | Ht 66.0 in | Wt 339.8 lb

## 2023-09-11 DIAGNOSIS — Z720 Tobacco use: Secondary | ICD-10-CM

## 2023-09-11 DIAGNOSIS — R0602 Shortness of breath: Secondary | ICD-10-CM | POA: Diagnosis not present

## 2023-09-11 DIAGNOSIS — R911 Solitary pulmonary nodule: Secondary | ICD-10-CM | POA: Diagnosis not present

## 2023-09-11 NOTE — Progress Notes (Signed)
 Rose Gutierrez    [id number]    04-Jan-1981  Primary Care Physician:Paseda, Folashade R, FNP  Referring Physician: Paseda, Folashade R, FNP [phone] [address], [address] [place],  [place] [address] Reason for Consultation: lung nodule, shortness of breath Date of Consultation: 09/11/2023  Chief complaint:   Chief Complaint  Patient presents with   Consult    Lung nodule,sob     HPI: Discussed the use of AI scribe software for clinical note transcription with the patient, who gave verbal consent to proceed.  History of Present Illness Rose Gutierrez is a 43 year old female who presents with a lung nodule discovered after a pneumonia diagnosis.  In February, she was admitted to the emergency room with symptoms of chest tightness, throat constriction, coughing, and chest pain, leading to a diagnosis of pneumonia. During the hospital stay, a CT scan revealed a small lung nodule in the right lung. She was treated with antibiotics and steroids and discharged home.  She has a history of asthma since childhood and has experienced multiple episodes of upper respiratory issues, often treated with prednisone . She uses an albuterol  inhaler two to three times a week, but it does not significantly improve her symptoms. She experiences shortness of breath during daily activities, particularly at work, where she encounters sneezing and chest tightness in certain areas.  She has a history of smoking since age 9 or 47, with a peak consumption of about a pack a day, but quit smoking a week ago. She attempted to use nicotine  patches but ultimately quit on her own.  She has sleep apnea and recently completed a home sleep study, awaiting results. She does not currently use CPAP but had been scheduled for it in the past.  Family history includes her father requiring oxygen support, though he does not have a history of smoking cigarettes, only marijuana.  She lives with her daughter,  grandchildren, and other family members, and works in Therapist, sports, which involves significant physical activity.    Social history:  Occupation: Exposures: Smoking history:  Social History   Occupational History   Not on file  Tobacco Use   Smoking status: Former    Current packs/day: 0.00    Average packs/day: 1 pack/day for 25.4 years (25.4 ttl pk-yrs)    Types: Cigarettes    Start date: 2000    Quit date: 09/04/2023    Years since quitting: 0.0   Smokeless tobacco: Never  Substance and Sexual Activity   Alcohol use: Yes    Comment: occasionally    Drug use: No   Sexual activity: Yes    Birth control/protection: None    Relevant family history:  Family History  Problem Relation Age of Onset   Cancer Mother        breast   Asthma Neg Hx     Past Medical History:  Diagnosis Date   Anxiety    Asthma    Chlamydia    Depression    Hypertension    Sleep apnea    Suicidal ideation    Trichomonas infection     Past Surgical History:  Procedure Laterality Date   CESAREAN SECTION     cesearean section       Physical Exam: Blood pressure (!) 143/93, pulse 93, height 5\' 6"  (1.676 m), weight (!) 339 lb 12.8 oz (154.1 kg), SpO2 97%. Gen:      No acute distress, obese ENT:  mallampati IV, no nasal  polyps, mucus membranes moist Lungs:    No increased respiratory effort, symmetric chest wall excursion, clear to auscultation bilaterally, no wheezes or crackles CV:         Regular rate and rhythm; no murmurs, rubs, or gallops.  No pedal edema Abd:      + bowel sounds; soft, non-tender; no distension MSK: no acute synovitis of DIP or PIP joints, no mechanics hands.  Skin:      Warm and dry; no rashes Neuro: normal speech, no focal facial asymmetry Psych: alert and oriented x3, normal mood and affect   Data Reviewed/Medical Decision Making:  Independent interpretation of tests: Imaging:  Review of patient's CTPE study Feb 2025 images revealed RLL ground  glass nodule, no PE. The patient's images have been independently reviewed by me.    PFTs:    Labs:  Lab Results  Component Value Date   NA 136 09/05/2023   K 3.7 09/05/2023   CO2 26 09/05/2023   GLUCOSE 93 09/05/2023   BUN 11 09/05/2023   CREATININE 0.79 09/05/2023   CALCIUM 8.8 (L) 09/05/2023   GFRNONAA >60 09/05/2023   Lab Results  Component Value Date   WBC 7.6 09/05/2023   HGB 11.0 (L) 09/05/2023   HCT 36.8 09/05/2023   MCV 90.0 09/05/2023   PLT 389 09/05/2023     Immunization status:  Immunization History  Administered Date(s) Administered   HPV 9-valent 12/03/2017, 02/06/2018, 06/04/2018   HPV Quadrivalent 06/04/2018   Tdap 12/03/2017     I reviewed prior external note(s) from hospital stay  I reviewed the result(s) of the labs and imaging as noted above.   I have ordered CT Scan    Assessment and Plan Assessment & Plan Asthma Childhood asthma with recent exacerbation. Increased albuterol  use with limited relief. Possible early COPD due to smoking history. Differential includes asthma and other causes of dyspnea. - Continue albuterol  inhaler as needed. - Order pulmonary function testing to assess breathing cycle abnormalities. - Encourage smoking cessation to improve asthma control. - consider trial of maintenance ICS   Tobacco use Long history of smoking, recently quit last week. Smoking cessation is crucial for improving respiratory health and preventing further complications. - Encourage continued smoking cessation. - Discuss strategies to manage cravings, such as using gum or hard candy after meals or when in the car  Lung nodule 11mm right lower lobe lung nodule identified during pneumonia hospitalization. Likely inflammatory from pneumonia. Low malignancy suspicion due to smoking history, but follow-up necessary. - Order repeat chest CT to assess lung nodule resolution.  Sleep apnea Sleep apnea with recent home sleep study conducted. Awaiting  results to determine need for CPAP therapy. - Review results of recent home sleep study. - Consider CPAP therapy if sleep apnea is confirmed. - consider zepbound with pcp if positive for OSA   Return to Care: Return in about 2 months (around 11/11/2023).  Louie Rover, MD Pulmonary and Critical Care Medicine [facility] [phone]  CC: Paseda, Folashade R, FNP

## 2023-09-11 NOTE — Patient Instructions (Addendum)
 It was a pleasure to see you today!  Please schedule follow up with myself in 6-8 weeks.  If my schedule is not open yet, we will contact you with a reminder closer to that time. Please call [phone] if you haven't heard from us  a month before, and always call us  sooner if issues or concerns arise. You can also send us  a message through MyChart, but but aware that this is not to be used for urgent issues and it may take up to 5-7 days to receive a reply. Please be aware that you will likely be able to view your results before I have a chance to respond to them. Please give us  5 business days to respond to any non-urgent results.    Before your next visit I would like you to have: PFTs   YOUR PLAN:  -ASTHMA: Asthma is a condition where your airways narrow and swell, making it difficult to breathe. You should continue using your albuterol  inhaler as needed  for symptoms of wheezing, chest tightness, shortness of breath. We will also order pulmonary function testing to better understand your breathing issues. It's important to continue avoiding smoking to help control your asthma.   -TOBACCO USE: Smoking can cause significant damage to your lungs and overall health. You have successfully quit smoking, which is excellent. To help manage cravings, consider using gum or hard candy, especially after meals or when driving.  -LUNG NODULE: A lung nodule is a small growth in the lung, which in your case was likely caused by inflammation from pneumonia. We will order a repeat chest CT scan to check if the nodule has resolved.  -SLEEP APNEA: Sleep apnea is a condition where your breathing repeatedly stops and starts during sleep. We are waiting for the results of your recent home sleep study to determine if you need CPAP therapy.  INSTRUCTIONS:  Please continue using your albuterol  inhaler as needed and avoid smoking. We will schedule pulmonary function testing and a repeat chest CT scan. Once the  results of your home sleep study are available, we will review them to decide on the next steps for your sleep apnea. Follow up with us  after these tests are completed.

## 2023-09-12 ENCOUNTER — Inpatient Hospital Stay: Payer: Self-pay | Admitting: Nurse Practitioner

## 2023-09-19 ENCOUNTER — Ambulatory Visit (HOSPITAL_COMMUNITY)
Admission: RE | Admit: 2023-09-19 | Discharge: 2023-09-19 | Disposition: A | Discharge status: Home / Self Care | Source: Ambulatory Visit | Attending: Internal Medicine | Admitting: Internal Medicine

## 2023-09-19 ENCOUNTER — Encounter (HOSPITAL_COMMUNITY): Payer: Self-pay

## 2023-09-19 DIAGNOSIS — R911 Solitary pulmonary nodule: Secondary | ICD-10-CM | POA: Diagnosis not present

## 2023-09-19 DIAGNOSIS — R599 Enlarged lymph nodes, unspecified: Secondary | ICD-10-CM | POA: Diagnosis not present

## 2023-09-20 DIAGNOSIS — G4733 Obstructive sleep apnea (adult) (pediatric): Secondary | ICD-10-CM | POA: Diagnosis not present

## 2023-09-20 NOTE — Procedures (Signed)
 [facility] [address] [place], [place] [address] Tel: [phone]   Fax: [phone]  Home Sleep Test Interpretation  Patient Name: Rose Gutierrez, Rose Gutierrez Date: 09/11/2023  Date of Birth: 10-Sep-1980 Study Type: HST  Age: 43 year MRN #: [id number]  Sex: Female Interpreting Physician: Rosa College [id number]  Height: 5\' 6"  Referring Physician: Folashade R. Paseda, FNP  Weight: 338.0 lbs Recording Tech: Alvah Auerbach RRT RPSGT RST  BMI: 55.0 Scoring Tech: Holly Neeriemer RPSGT RST  ESS: 23 Neck Size: 17   Indications for Polysomnography The patient is a 43 year-old Female who is 5\' 6"  and weighs 338.0 lbs. Her BMI equals 55.0.  A home sleep apnea test was performed to evaluate for -snoring.  Medication  No Data.   Polysomnogram Data A home sleep test recorded the standard physiologic parameters including EKG, nasal and oral airflow.  Respiratory parameters of chest and abdominal movements were recorded with Respiratory Inductance Plethysmography belts.  Oxygen saturation was recorded by pulse oximetry.   Study Architecture The total recording time of the polysomnogram was 359.6 minutes.  The total monitoring time was 360.0 minutes.  Time spent in Supine position was 76.0 minutes.   Respiratory Events The study revealed a presence of 460 obstructive, - central, and - mixed apneas resulting in an Apnea index of 76.7 events per hour.  There were 182 hypopneas (>=3% desaturation and/or arousal) resulting in an Apnea\Hypopnea Index (AHI >=3% desaturation and/or arousal) of 107.0 events per hour.  There were 156 hypopneas (>=4% desaturation) resulting in an Apnea\Hypopnea Index (AHI >=4% desaturation) of 102.7 events per hour.  There were - Respiratory Effort Related Arousals resulting in a RERA index of - events per hour. The Respiratory Disturbance Index is 107.0 events per hour.  The snore index was 104.8 events per hour.  Mean oxygen  saturation was 88.1%.  The lowest oxygen saturation during monitoring time was 55.0%.  Time spent <=88% oxygen saturation was 149.8 minutes (42.2%).  Cardiac Summary The average pulse rate was 83.3 bpm.  The minimum pulse rate was 47.0 bpm while the maximum pulse rate was 113.0 bpm.  Cardiac rhythm was normal.  Comment:  Very severe obstructive sleep apnea, AHI (4%) 102.7/hr. Snoring with oxyg3n desaturation to a nadir of 55%, mean 88.1%, indicating Nocturnal Hypoxemia.  Diagnosis: Obstructive sleep apnea, Nocturnal Hypoxemia  Recommendations: Suggest CPAP titration sleep study, which would allow transition to BIPAP if needed and would also document for insurance if supplemental O2 is needed.   This study was personally reviewed and electronically signed by: Dr. Faustina Hood Accredited Board Certified in Sleep Medicine Date/Time: 09/20/23  12:10   Study Overview  Recording Time: 433.1 min. Monitoring Time: 360.0 min.  Analysis Start:  12:26:34 AM Supine Time: 76.0 min.  Analysis Stop:  06:26:06 AM     Study Summary   Count Index Longest Event Duration  Apneas & Hypopneas: 642 107.0  Apneas: 37.1 sec.     Hypopneas: 27.5 sec.  RERAs: - - - sec.  Desaturations: 655 109.2 73.0 sec.  Snores: 629 104.8 123.2 sec.    Minimum Oxygen Saturation: 55.0%    Respiratory Summary   Total Duration Supine Non-Supine   Count Index Average Longest Count Index Count Index  Obstructive Apnea 460 76.7 17.2 37.1 78 61.6 382 80.7   Mixed Apnea - - - - - - - -   Central Apnea - - - - - - - -   Total  Apneas 460 76.7 17.2 37.1 78 61.6 382 80.7            Hypopneas 3% 182 30.3 N.A. N.A. 82 64.7 100 21.1   Apneas & Hyp. 3% 642 107.0 N.A. N.A. 160 126.3 482 101.8            Hypopneas 4% 156 26.0 N.A. N.A. 78 61.6 78 16.5  Apneas & Hyp. 4% 616 102.7 N.A. N.A. 156 123.2 460 97.2             RERAs - - - - - - - -  RDI 646 107.7 N.A. N.A. 160 126.3 486 102.7   Oxygen Saturation Summary    Total Supine Non-Supine  Average SpO2 88.1% 86.6% 88.6%  Minimum SpO2 55.0% 66.0% 55.0%   Maximum SpO2 100.0% 98.0% 100.0%   Oxygen Saturation Distribution  Range (%) Time in range (min) Time in range (%)  90.0 - 100.0 183.8 51.7%  80.0 - 90.0 88.7 25.0%  70.0 - 80.0 65.3 18.4%  60.0 - 70.0 16.4 4.6%  50.0 - 60.0 1.1 0.3%  0.0 - 50.0 - -  Time Spent <=88% SpO2  Range (%) Time in range (min) Time in range (%)  0.0 - 88.0 149.8 42.2%  Cardiac Summary   Total Supine Non-Supine  Average Pulse Rate (BPM) 83.3 85.2 82.8  Minimum Pulse Rate (BPM) 47.0 74.0 47.0  Maximum Pulse Rate (BPM) 113.0 113.0 108.0                      Technologist Comments  -                         Jolee Naval, Biomedical engineer of Sleep Medicine  ELECTRONICALLY SIGNED ON:  09/20/2023, 12:05 PM [facility] PH: [phone]   FX: [phone] ACCREDITED BY THE [employer] OF SLEEP MEDICINE

## 2023-09-29 ENCOUNTER — Encounter: Payer: Self-pay | Admitting: Nurse Practitioner

## 2023-09-29 ENCOUNTER — Ambulatory Visit: Payer: Self-pay | Admitting: Internal Medicine

## 2023-09-29 ENCOUNTER — Other Ambulatory Visit: Payer: Self-pay | Admitting: Nurse Practitioner

## 2023-09-29 ENCOUNTER — Telehealth (INDEPENDENT_AMBULATORY_CARE_PROVIDER_SITE_OTHER): Payer: Self-pay | Admitting: Nurse Practitioner

## 2023-09-29 ENCOUNTER — Other Ambulatory Visit: Payer: Self-pay

## 2023-09-29 DIAGNOSIS — G4733 Obstructive sleep apnea (adult) (pediatric): Secondary | ICD-10-CM

## 2023-09-29 DIAGNOSIS — I10 Essential (primary) hypertension: Secondary | ICD-10-CM

## 2023-09-29 DIAGNOSIS — R600 Localized edema: Secondary | ICD-10-CM

## 2023-09-29 MED ORDER — BLOOD PRESSURE KIT DEVI: 1.0000 | 0 refills | Status: AC

## 2023-09-29 MED ORDER — AMLODIPINE BESYLATE 5 MG PO TABS: 5.0000 mg | ORAL_TABLET | Freq: Every day | ORAL | 0 refills | Status: DC

## 2023-09-29 MED ORDER — WEGOVY 0.5 MG/0.5ML ~~LOC~~ SOAJ: 0.5000 mg | SUBCUTANEOUS | 0 refills | Status: DC

## 2023-09-29 MED ORDER — HYDROCHLOROTHIAZIDE 12.5 MG PO TABS: 12.5000 mg | ORAL_TABLET | Freq: Every day | ORAL | 0 refills | Status: DC

## 2023-09-29 NOTE — Assessment & Plan Note (Signed)
 Start Wegovy  0.5 mg weekly Patient counseled on low-carb diet, encouraged to engage in regular moderate to vigorous exercises at least 150 minutes weekly as tolerated Follow-up in 4 weeks

## 2023-09-29 NOTE — Assessment & Plan Note (Signed)
 CPAP machine has been ordered

## 2023-09-29 NOTE — Assessment & Plan Note (Signed)
 DASH diet, elevation and use of compression socks discussed Starting hydrochlorothiazide  12.5 mg daily decreasing amlodipine  to 5 mg daily

## 2023-09-29 NOTE — Progress Notes (Signed)
 Virtual Visit via video note  I connected with Rose Gutierrez @ on 09/29/23 at 10:20 AM EDT by telephone and verified that I am speaking with the correct person using two identifiers.  I spent 12 minutes on this video encounter  Location: Patient: work  Provider: home    I discussed the limitations, risks, security and privacy concerns of performing an evaluation and management service by telephone and the availability of in person appointments. I also discussed with the patient that there may be a patient responsible charge related to this service. The patient expressed understanding and agreed to proceed.   History of Present Illness: Rose Gutierrez has a past medical history of Anxiety, Asthma, Chlamydia, Depression, Hypertension, Sleep apnea, Suicidal ideation, and Trichomonas infection.  Patient presents for follow-up for obesity  Obesity.  Currently on Wegovy  0.25 mg once weekly, she has not been monitoring her weight, has been walking more and trying to follow a low-carb diet, she plans on going to the gym soon.  She denies any adverse reactions to Wegovy   Bilateral lower extremity edema.  Patient complains of bilateral lower extremity edema that is worse since she started taking amlodipine .  Currently on amlodipine  10 mg daily for hypertension  Obstructive sleep apnea.  CPAP machine has been ordered     Observations/Objective: Patient alert and oriented, no sign of acute distress noted  Assessment and Plan: HTN (hypertension) Due to complaints of lower extremity edema with decreased amlodipine  to 5 mg daily and start hydrochlorothiazide  12.5 mg daily Blood pressure monitor ordered Will check BMP in 2 weeks, follow-up in the office in 4 weeks DASH diet and commitment to daily physical activity for a minimum of 30 minutes discussed and encouraged, as a part of hypertension management. The importance of attaining a healthy weight is also discussed.     09/29/2023   10:37 AM  09/11/2023    8:23 AM 09/10/2023    4:12 PM 09/05/2023    2:13 PM 09/05/2023   10:34 AM 09/05/2023   10:25 AM 09/05/2023    9:17 AM  BP/Weight  Systolic BP  143  148  147 116  Diastolic BP  93  94  115 74  Wt. (Lbs) 339 339.8 334  338  338  BMI 54.72 kg/m2 54.85 kg/m2 53.91 kg/m2  58.02 kg/m2  58.02 kg/m2       Morbid obesity (HCC) Start Wegovy  0.5 mg weekly Patient counseled on low-carb diet, encouraged to engage in regular moderate to vigorous exercises at least 150 minutes weekly as tolerated Follow-up in 4 weeks  OSA (obstructive sleep apnea) CPAP machine has been ordered  Bilateral lower extremity edema DASH diet, elevation and use of compression socks discussed Starting hydrochlorothiazide  12.5 mg daily decreasing amlodipine  to 5 mg daily   Follow Up Instructions:    I discussed the assessment and treatment plan with the patient. The patient was provided an opportunity to ask questions and all were answered. The patient agreed with the plan and demonstrated an understanding of the instructions.   The patient was advised to call back or seek an in-person evaluation if the symptoms worsen or if the condition fails to improve as anticipated.

## 2023-09-29 NOTE — Assessment & Plan Note (Signed)
 Due to complaints of lower extremity edema with decreased amlodipine  to 5 mg daily and start hydrochlorothiazide  12.5 mg daily Blood pressure monitor ordered Will check BMP in 2 weeks, follow-up in the office in 4 weeks DASH diet and commitment to daily physical activity for a minimum of 30 minutes discussed and encouraged, as a part of hypertension management. The importance of attaining a healthy weight is also discussed.     09/29/2023   10:37 AM 09/11/2023    8:23 AM 09/10/2023    4:12 PM 09/05/2023    2:13 PM 09/05/2023   10:34 AM 09/05/2023   10:25 AM 09/05/2023    9:17 AM  BP/Weight  Systolic BP  143  148  147 116  Diastolic BP  93  94  115 74  Wt. (Lbs) 339 339.8 334  338  338  BMI 54.72 kg/m2 54.85 kg/m2 53.91 kg/m2  58.02 kg/m2  58.02 kg/m2

## 2023-09-29 NOTE — Patient Instructions (Signed)
 For the swelling in your lower extremities, be sure to elevate your legs when able, mind the salt intake, stay physically active and consider wearing compression stockings.   Please start taking amlodipine  5 mg daily and hydrochlorothiazide  12.5 mg daily Lets recheck your labs in 2 weeks   Start Ozempic 0.5 mg once weekly for obesity   1. Morbid obesity (HCC) (Primary)  - Semaglutide -Weight Management (WEGOVY ) 0.5 MG/0.5ML SOAJ; Inject 0.5 mg into the skin once a week.  Dispense: 2 mL; Refill: 0  2. Primary hypertension  - hydrochlorothiazide  (HYDRODIURIL ) 12.5 MG tablet; Take 1 tablet (12.5 mg total) by mouth daily.  Dispense: 60 tablet; Refill: 0 - amLODipine  (NORVASC ) 5 MG tablet; Take 1 tablet (5 mg total) by mouth daily.  Dispense: 90 tablet; Refill: 0 - Basic Metabolic Panel; Future     It is important that you exercise regularly at least 30 minutes 5 times a week as tolerated  Think about what you will eat, plan ahead. Choose " clean, green, fresh or frozen" over canned, processed or packaged foods which are more sugary, salty and fatty. 70 to 75% of food eaten should be vegetables and fruit. Three meals at set times with snacks allowed between meals, but they must be fruit or vegetables. Aim to eat over a 12 hour period , example 7 am to 7 pm, and STOP after  your last meal of the day. Drink water,generally about 64 ounces per day, no other drink is as healthy. Fruit juice is best enjoyed in a healthy way, by EATING the fruit.  Thanks for choosing [facility] we consider it a privelige to serve you.

## 2023-09-30 ENCOUNTER — Ambulatory Visit: Payer: Self-pay | Admitting: Nurse Practitioner

## 2023-10-02 DIAGNOSIS — Z419 Encounter for procedure for purposes other than remedying health state, unspecified: Secondary | ICD-10-CM | POA: Diagnosis not present

## 2023-10-06 ENCOUNTER — Telehealth: Payer: Self-pay

## 2023-10-06 NOTE — Telephone Encounter (Signed)
 Copied from CRM [id number]. Topic: General - Other >> Oct 06, 2023 12:36 PM Carlatta H wrote: Reason for CRM: Will be sending order for blood pressure monitor.   Order was sent to home care delivery 10/03/23 . KH

## 2023-10-10 DIAGNOSIS — R7303 Prediabetes: Secondary | ICD-10-CM | POA: Diagnosis not present

## 2023-10-10 DIAGNOSIS — I10 Essential (primary) hypertension: Secondary | ICD-10-CM | POA: Diagnosis not present

## 2023-10-13 ENCOUNTER — Other Ambulatory Visit: Payer: Self-pay

## 2023-10-13 DIAGNOSIS — I10 Essential (primary) hypertension: Secondary | ICD-10-CM | POA: Diagnosis not present

## 2023-10-14 ENCOUNTER — Ambulatory Visit: Payer: Self-pay | Admitting: Nurse Practitioner

## 2023-10-14 LAB — BASIC METABOLIC PANEL WITH GFR
BUN/Creatinine Ratio: 14 (ref 9–23)
BUN: 13 mg/dL (ref 6–24)
CO2: 23 mmol/L (ref 20–29)
Calcium: 9.4 mg/dL (ref 8.7–10.2)
Chloride: 99 mmol/L (ref 96–106)
Creatinine, Ser: 0.94 mg/dL (ref 0.57–1.00)
Glucose: 97 mg/dL (ref 70–99)
Potassium: 4.2 mmol/L (ref 3.5–5.2)
Sodium: 140 mmol/L (ref 134–144)
eGFR: 79 mL/min/{1.73_m2} (ref >59)

## 2023-10-30 ENCOUNTER — Telehealth: Payer: Self-pay

## 2023-10-30 ENCOUNTER — Other Ambulatory Visit: Payer: Self-pay

## 2023-10-30 NOTE — Telephone Encounter (Signed)
 Pt was advised that we will wait on Fola to return and for her appt 11/10/23 [facility]

## 2023-10-30 NOTE — Telephone Encounter (Signed)
 Pt needs next dose of wegovy . Pt has follow up 11/10/2023 with you. Thanks kh

## 2023-11-01 DIAGNOSIS — Z419 Encounter for procedure for purposes other than remedying health state, unspecified: Secondary | ICD-10-CM | POA: Diagnosis not present

## 2023-11-03 ENCOUNTER — Other Ambulatory Visit: Payer: Self-pay | Admitting: Nurse Practitioner

## 2023-11-03 MED ORDER — SEMAGLUTIDE-WEIGHT MANAGEMENT 1 MG/0.5ML ~~LOC~~ SOAJ: 1.0000 mg | SUBCUTANEOUS | 0 refills | Status: DC

## 2023-11-10 ENCOUNTER — Encounter

## 2023-11-10 ENCOUNTER — Ambulatory Visit: Admitting: Internal Medicine

## 2023-11-10 ENCOUNTER — Encounter: Payer: Self-pay | Admitting: Nurse Practitioner

## 2023-11-10 ENCOUNTER — Ambulatory Visit (INDEPENDENT_AMBULATORY_CARE_PROVIDER_SITE_OTHER): Payer: Self-pay | Admitting: Nurse Practitioner

## 2023-11-10 ENCOUNTER — Ambulatory Visit (INDEPENDENT_AMBULATORY_CARE_PROVIDER_SITE_OTHER): Admitting: Internal Medicine

## 2023-11-10 ENCOUNTER — Encounter: Payer: Self-pay | Admitting: Internal Medicine

## 2023-11-10 VITALS — BP 124/80 | HR 90 | Ht 66.0 in | Wt 323.0 lb

## 2023-11-10 VITALS — BP 114/69 | HR 90 | Temp 97.7°F | Wt 323.4 lb

## 2023-11-10 DIAGNOSIS — I10 Essential (primary) hypertension: Secondary | ICD-10-CM

## 2023-11-10 DIAGNOSIS — Z87891 Personal history of nicotine dependence: Secondary | ICD-10-CM

## 2023-11-10 DIAGNOSIS — F32A Depression, unspecified: Secondary | ICD-10-CM

## 2023-11-10 DIAGNOSIS — R0602 Shortness of breath: Secondary | ICD-10-CM

## 2023-11-10 DIAGNOSIS — J4540 Moderate persistent asthma, uncomplicated: Secondary | ICD-10-CM

## 2023-11-10 DIAGNOSIS — F419 Anxiety disorder, unspecified: Secondary | ICD-10-CM | POA: Diagnosis not present

## 2023-11-10 DIAGNOSIS — Z124 Encounter for screening for malignant neoplasm of cervix: Secondary | ICD-10-CM | POA: Diagnosis not present

## 2023-11-10 DIAGNOSIS — G4730 Sleep apnea, unspecified: Secondary | ICD-10-CM

## 2023-11-10 DIAGNOSIS — Z720 Tobacco use: Secondary | ICD-10-CM

## 2023-11-10 DIAGNOSIS — J309 Allergic rhinitis, unspecified: Secondary | ICD-10-CM | POA: Diagnosis not present

## 2023-11-10 DIAGNOSIS — Z1231 Encounter for screening mammogram for malignant neoplasm of breast: Secondary | ICD-10-CM

## 2023-11-10 DIAGNOSIS — G4733 Obstructive sleep apnea (adult) (pediatric): Secondary | ICD-10-CM | POA: Diagnosis not present

## 2023-11-10 DIAGNOSIS — R911 Solitary pulmonary nodule: Secondary | ICD-10-CM | POA: Diagnosis not present

## 2023-11-10 LAB — PULMONARY FUNCTION TEST
FEF 25-75 Pre: 2.15 L/s
FEF2575-%Pred-Pre: 66 %
FEV1-%Pred-Pre: 64 %
FEV1-Pre: 2.07 L
FEV1FVC-%Pred-Pre: 102 %
FEV6-%Pred-Pre: 63 %
FEV6-Pre: 2.44 L
FEV6FVC-%Pred-Pre: 101 %
FVC-%Pred-Pre: 61 %
FVC-Pre: 2.44 L
Pre FEV1/FVC ratio: 85 %
Pre FEV6/FVC Ratio: 100 %

## 2023-11-10 MED ORDER — FLUTICASONE PROPIONATE 50 MCG/ACT NA SUSP: 2.0000 | Freq: Every day | NASAL | 6 refills | Status: DC

## 2023-11-10 MED ORDER — HYDROCHLOROTHIAZIDE 12.5 MG PO TABS: 12.5000 mg | ORAL_TABLET | Freq: Every day | ORAL | 1 refills | Status: DC

## 2023-11-10 MED ORDER — MOMETASONE FURO-FORMOTEROL FUM 100-5 MCG/ACT IN AERO: 2.0000 | INHALATION_SPRAY | Freq: Two times a day (BID) | RESPIRATORY_TRACT | 5 refills | Status: AC

## 2023-11-10 MED ORDER — MONTELUKAST SODIUM 10 MG PO TABS: 10.0000 mg | ORAL_TABLET | Freq: Every day | ORAL | 11 refills | Status: AC

## 2023-11-10 MED ORDER — BUPROPION HCL ER (SR) 150 MG PO TB12: ORAL_TABLET | ORAL | 3 refills | Status: DC

## 2023-11-10 MED ORDER — WEGOVY 1.7 MG/0.75ML ~~LOC~~ SOAJ: 1.7000 mg | SUBCUTANEOUS | 0 refills | Status: DC

## 2023-11-10 MED ORDER — CETIRIZINE HCL 10 MG PO TABS: 10.0000 mg | ORAL_TABLET | Freq: Every day | ORAL | 11 refills | Status: DC

## 2023-11-10 MED ORDER — AMLODIPINE BESYLATE 5 MG PO TABS: 5.0000 mg | ORAL_TABLET | Freq: Every day | ORAL | 1 refills | Status: DC

## 2023-11-10 NOTE — Progress Notes (Signed)
 Established Patient Office Visit  Subjective:  Patient ID: Rose Gutierrez, female    DOB: 1981/02/13  Age: 43 y.o. MRN: [id number]  CC:  Chief Complaint  Patient presents with   Weight Check    HPI Rose Gutierrez is a 43 y.o. female  has a past medical history of Anxiety, Asthma, Chlamydia, Depression, Hypertension, Sleep apnea, Suicidal ideation, and Trichomonas infection.  Patient states that she is doing well generally She denies any adverse reactions to current medications Please see assessment and plan section for full HPI and plan    Past Medical History:  Diagnosis Date   Anxiety    Asthma    Chlamydia    Depression    Hypertension    Sleep apnea    Suicidal ideation    Trichomonas infection     Past Surgical History:  Procedure Laterality Date   CESAREAN SECTION     cesearean section      Family History  Problem Relation Age of Onset   Cancer Mother        breast   Asthma Neg Hx     Social History   Socioeconomic History   Marital status: Single    Spouse name: Not on file   Number of children: 2   Years of education: Not on file   Highest education level: Not on file  Occupational History   Not on file  Tobacco Use   Smoking status: Former    Current packs/day: 0.00    Average packs/day: 1 pack/day for 25.4 years (25.4 ttl pk-yrs)    Types: Cigarettes    Start date: 2000    Quit date: 09/04/2023    Years since quitting: 0.1   Smokeless tobacco: Never  Substance and Sexual Activity   Alcohol use: Yes    Comment: occasionally    Drug use: No   Sexual activity: Yes    Birth control/protection: None  Other Topics Concern   Not on file  Social History Narrative   [place]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    Social Drivers of Health   Financial Resource Strain: Not on file  Food Insecurity: Food Insecurity Present (06/05/2023)   Hunger Vital Sign    Worried About Running Out of Food in the Last Year: Sometimes true    Ran Out of Food in  the Last Year: Sometimes true  Transportation Needs: No Transportation Needs (06/05/2023)   PRAPARE - Administrator, Civil Service (Medical): No    Lack of Transportation (Non-Medical): No  Physical Activity: Not on file  Stress: Not on file  Social Connections: Moderately Isolated (06/05/2023)   Social Connection and Isolation Panel    Frequency of Communication with Friends and Family: More than three times a week    Frequency of Social Gatherings with Friends and Family: More than three times a week    Attends Religious Services: More than 4 times per year    Active Member of [employer] or Organizations: No    Attends Banker Meetings: Never    Marital Status: Never married  Intimate Partner Violence: Not At Risk (06/05/2023)   Humiliation, Afraid, Rape, and Kick questionnaire    Fear of Current or Ex-Partner: No    Emotionally Abused: No    Physically Abused: No    Sexually Abused: No    Outpatient Medications Prior to Visit  Medication Sig Dispense Refill   albuterol  (VENTOLIN  HFA) 108 (90 Base) MCG/ACT inhaler Inhale 2 puffs  into the lungs every 6 (six) hours as needed for wheezing or shortness of breath. 8 g 2   Blood Pressure Monitoring (BLOOD PRESSURE KIT) DEVI 1 each by Does not apply route as directed. 1 each 0   busPIRone  (BUSPAR ) 5 MG tablet Take 1 tablet (5 mg total) by mouth 3 (three) times daily. 90 tablet 3   cromolyn (OPTICROM) 4 % ophthalmic solution Place 1 drop into both eyes in the morning and at bedtime.     Semaglutide -Weight Management 1 MG/0.5ML SOAJ Inject 1 mg into the skin once a week. 2 mL 0   XIIDRA 5 % SOLN      amLODipine  (NORVASC ) 5 MG tablet Take 1 tablet (5 mg total) by mouth daily. 90 tablet 0   buPROPion  (WELLBUTRIN  SR) 150 MG 12 hr tablet Take  150 mg once daily for 3 days; increase to 150 mg twice daily 60 tablet 3   fluticasone  (FLONASE ) 50 MCG/ACT nasal spray Place 2 sprays into both nostrils daily. 16 g 6    hydrochlorothiazide  (HYDRODIURIL ) 12.5 MG tablet Take 1 tablet (12.5 mg total) by mouth daily. 60 tablet 0   loratadine  (CLARITIN ) 10 MG tablet Take 1 tablet (10 mg total) by mouth daily. 30 tablet 4   aspirin EC 325 MG tablet Take 325 mg by mouth every 4 (four) hours as needed for moderate pain (pain score 4-6). (Patient not taking: Reported on 11/10/2023)     guaiFENesin -dextromethorphan (ROBITUSSIN DM) 100-10 MG/5ML syrup Take 5 mLs by mouth 3 (three) times daily as needed for cough. (Patient not taking: Reported on 11/10/2023) 118 mL 0   No facility-administered medications prior to visit.    No Known Allergies  ROS Review of Systems  Constitutional:  Negative for appetite change, chills, fatigue and fever.  HENT:  Positive for congestion and sneezing. Negative for postnasal drip and rhinorrhea.   Respiratory:  Negative for cough, shortness of breath and wheezing.   Cardiovascular:  Negative for chest pain, palpitations and leg swelling.  Gastrointestinal:  Negative for abdominal pain, constipation, nausea and vomiting.  Genitourinary:  Negative for difficulty urinating, dysuria, flank pain and frequency.  Musculoskeletal:  Negative for arthralgias, back pain, joint swelling and myalgias.  Skin:  Negative for color change, pallor, rash and wound.  Neurological:  Negative for dizziness, facial asymmetry, weakness, numbness and headaches.  Psychiatric/Behavioral:  Negative for behavioral problems, confusion, self-injury and suicidal ideas.       Objective:    Physical Exam Vitals and nursing note reviewed.  Constitutional:      General: She is not in acute distress.    Appearance: Normal appearance. She is obese. She is not ill-appearing, toxic-appearing or diaphoretic.  HENT:     Nose: No congestion or rhinorrhea.  Eyes:     General: No scleral icterus.       Right eye: No discharge.        Left eye: No discharge.     Extraocular Movements: Extraocular movements intact.      Conjunctiva/sclera: Conjunctivae normal.  Cardiovascular:     Rate and Rhythm: Normal rate and regular rhythm.     Pulses: Normal pulses.     Heart sounds: Normal heart sounds. No murmur heard.    No friction rub. No gallop.  Pulmonary:     Effort: Pulmonary effort is normal. No respiratory distress.     Breath sounds: Normal breath sounds. No stridor. No wheezing, rhonchi or rales.  Chest:     Chest wall:  No tenderness.  Abdominal:     General: There is no distension.     Palpations: Abdomen is soft.     Tenderness: There is no abdominal tenderness. There is no right CVA tenderness, left CVA tenderness or guarding.  Musculoskeletal:        General: No swelling, tenderness, deformity or signs of injury.     Right lower leg: No edema.     Left lower leg: No edema.  Skin:    General: Skin is warm and dry.     Capillary Refill: Capillary refill takes less than 2 seconds.     Coloration: Skin is not jaundiced or pale.     Findings: No bruising, erythema or lesion.  Neurological:     Mental Status: She is alert and oriented to person, place, and time.     Motor: No weakness.     Gait: Gait normal.  Psychiatric:        Mood and Affect: Mood normal.        Behavior: Behavior normal.        Thought Content: Thought content normal.        Judgment: Judgment normal.     BP 114/69   Pulse 90   Temp 97.7 F (36.5 C)   Wt (!) 323 lb 6.4 oz (146.7 kg)   SpO2 100%   BMI 52.20 kg/m  Wt Readings from Last 3 Encounters:  11/10/23 (!) 323 lb (146.5 kg)  11/10/23 (!) 323 lb 6.4 oz (146.7 kg)  09/29/23 (!) 339 lb (153.8 kg)    Lab Results  Component Value Date   TSH 1.870 11/03/2013   Lab Results  Component Value Date   WBC 7.6 09/05/2023   HGB 11.0 (L) 09/05/2023   HCT 36.8 09/05/2023   MCV 90.0 09/05/2023   PLT 389 09/05/2023   Lab Results  Component Value Date   NA 140 10/13/2023   K 4.2 10/13/2023   CO2 23 10/13/2023   GLUCOSE 97 10/13/2023   BUN 13 10/13/2023    CREATININE 0.94 10/13/2023   BILITOT 0.3 08/26/2014   ALKPHOS 60 08/26/2014   AST 16 08/26/2014   ALT 19 08/26/2014   PROT 7.8 08/26/2014   ALBUMIN 4.0 08/26/2014   CALCIUM 9.4 10/13/2023   ANIONGAP 9 09/05/2023   EGFR 79 10/13/2023   No results found for: CHOL No results found for: HDL No results found for: LDLCALC No results found for: TRIG No results found for: CHOLHDL Lab Results  Component Value Date   HGBA1C 6.1 (A) 07/14/2023      Assessment & Plan:   Problem List Items Addressed This Visit       Cardiovascular and Mediastinum   HTN (hypertension)   Controlled on amlodipine  5 mg daily, hydrochlorothiazide  12.5 mg daily  Continue current medications  DASH diet and commitment to daily physical activity for a minimum of 30 minutes discussed and encouraged, as a part of hypertension management. The importance of attaining a healthy weight is also discussed.     11/10/2023    2:29 PM 09/29/2023   10:37 AM 09/11/2023    8:23 AM 09/10/2023    4:12 PM 09/05/2023    2:13 PM 09/05/2023   10:34 AM 09/05/2023   10:25 AM  BP/Weight  Systolic BP 114  856  148  147  Diastolic BP 69  93  94  115  Wt. (Lbs) 323.4 339 339.8 334  338   BMI 52.2 kg/m2 54.72 kg/m2 54.85 kg/m2  53.91 kg/m2  58.02 kg/m2            Relevant Medications   amLODipine  (NORVASC ) 5 MG tablet   hydrochlorothiazide  (HYDRODIURIL ) 12.5 MG tablet     Respiratory   OSA (obstructive sleep apnea) - Primary    Dr Neysa Suggest CPAP titration sleep study, which would allow transition to BIPAP if needed and would also document for insurance if supplemental O2 is needed Will refer patient to [facility]       Allergic rhinitis   Taking Claritin  10 mg but her allergy symptoms is not well-controlled Has not been using Flonase  ,patient encouraged to consider using Flonase  2 spray into both nostrils daily Patient referred to allergist      Relevant Medications   fluticasone  (FLONASE ) 50 MCG/ACT nasal  spray   Other Relevant Orders   Ambulatory referral to Allergy     Other   Morbid obesity (HCC)   Wt Readings from Last 3 Encounters:  11/10/23 (!) 323 lb 6.4 oz (146.7 kg)  09/29/23 (!) 339 lb (153.8 kg)  09/11/23 (!) 339 lb 12.8 oz (154.1 kg)   Body mass index is 52.2 kg/m.  Doing well on Wegovy  1 mg once weekly injection She denies any adverse reactions to the medication She has lost 16 pounds since she started Wegovy  She plans to l start going to the G[facility] next week, will be seeing a nutritionist this month Does a lot of walking  at work, not doing much fried foods, cutting down on rice, does protion control, trying to avoid weight loss surgery Increase Wegovy  to 1.7 mg once weekly Patient counseled on low-carb diet, encouraged to engage in regular moderate to vigorous exercise at least 150 minutes weekly as tolerated, follow-up with a nutritionist as planned Follow-up in the office in 3 months.       Relevant Medications   Semaglutide -Weight Management (WEGOVY ) 1.7 MG/0.75ML SOAJ   Tobacco use   Currently not smoking Continue Wellbutrin  150 mg twice daily      Relevant Medications   buPROPion  (WELLBUTRIN  SR) 150 MG 12 hr tablet   Anxiety and depression      11/10/2023    2:34 PM 09/29/2023   10:36 AM 09/05/2023    9:20 AM 08/25/2023   11:58 AM  GAD 7 : Generalized Anxiety Score  Nervous, Anxious, on Edge 1 1 1  0  Control/stop worrying 0 0 0 1  Worry too much - different things 0 1 0 1  Trouble relaxing 3 2 3 3   Restless 0 0 0 0  Easily annoyed or irritable 1 0 0 1  Afraid - awful might happen 0 0 0 1  Total GAD 7 Score 5 4 4 7   Anxiety Difficulty Not difficult at all Not difficult at all Not difficult at all Not difficult at all      11/10/2023    2:32 PM 09/29/2023   10:33 AM 09/05/2023    9:21 AM  Depression screen PHQ 2/9  Decreased Interest 0 0 0  Down, Depressed, Hopeless 2 0 0  PHQ - 2 Score 2 0 0  Altered sleeping 3 3 3   Tired, decreased energy 3 2 3    Change in appetite 0 0 2  Feeling bad or failure about yourself  0 0 0  Trouble concentrating 0 0 1  Moving slowly or fidgety/restless 0 0 0  Suicidal thoughts 0 0 0  PHQ-9 Score 8 5 9   Difficult doing work/chores Not difficult at all Not  difficult at all Not difficult at all  She reports doing well on buspirone  5 mg 3 times daily and Wellbutrin  150 mg twice daily Does not want a higher dose of Wellbutrin  at this time Continue current medication Patient denies SI, HI        Relevant Medications   buPROPion  (WELLBUTRIN  SR) 150 MG 12 hr tablet   Other Visit Diagnoses       Screening for cervical cancer       Relevant Orders   Ambulatory referral to Gynecology     Screening mammogram for breast cancer       Relevant Orders   MM Digital Screening       Meds ordered this encounter  Medications   Semaglutide -Weight Management (WEGOVY ) 1.7 MG/0.75ML SOAJ    Sig: Inject 1.7 mg into the skin once a week.    Dispense:  3 mL    Refill:  0   buPROPion  (WELLBUTRIN  SR) 150 MG 12 hr tablet    Sig: Take  150 mg once daily for 3 days; increase to 150 mg twice daily    Dispense:  60 tablet    Refill:  3   fluticasone  (FLONASE ) 50 MCG/ACT nasal spray    Sig: Place 2 sprays into both nostrils daily.    Dispense:  16 g    Refill:  6   amLODipine  (NORVASC ) 5 MG tablet    Sig: Take 1 tablet (5 mg total) by mouth daily.    Dispense:  90 tablet    Refill:  1   hydrochlorothiazide  (HYDRODIURIL ) 12.5 MG tablet    Sig: Take 1 tablet (12.5 mg total) by mouth daily.    Dispense:  90 tablet    Refill:  1    Follow-up: Return in about 3 months (around 02/10/2024) for CPE.    Aizik Reh R Saniya Tranchina, FNP

## 2023-11-10 NOTE — Assessment & Plan Note (Addendum)
    11/10/2023    2:34 PM 09/29/2023   10:36 AM 09/05/2023    9:20 AM 08/25/2023   11:58 AM  GAD 7 : Generalized Anxiety Score  Nervous, Anxious, on Edge 1 1 1  0  Control/stop worrying 0 0 0 1  Worry too much - different things 0 1 0 1  Trouble relaxing 3 2 3 3   Restless 0 0 0 0  Easily annoyed or irritable 1 0 0 1  Afraid - awful might happen 0 0 0 1  Total GAD 7 Score 5 4 4 7   Anxiety Difficulty Not difficult at all Not difficult at all Not difficult at all Not difficult at all      11/10/2023    2:32 PM 09/29/2023   10:33 AM 09/05/2023    9:21 AM  Depression screen PHQ 2/9  Decreased Interest 0 0 0  Down, Depressed, Hopeless 2 0 0  PHQ - 2 Score 2 0 0  Altered sleeping 3 3 3   Tired, decreased energy 3 2 3   Change in appetite 0 0 2  Feeling bad or failure about yourself  0 0 0  Trouble concentrating 0 0 1  Moving slowly or fidgety/restless 0 0 0  Suicidal thoughts 0 0 0  PHQ-9 Score 8 5 9   Difficult doing work/chores Not difficult at all Not difficult at all Not difficult at all  She reports doing well on buspirone  5 mg 3 times daily and Wellbutrin  150 mg twice daily Does not want a higher dose of Wellbutrin  at this time Continue current medication Patient denies SI, HI

## 2023-11-10 NOTE — Patient Instructions (Addendum)
 It was a pleasure to see you today!  Please schedule follow up with myself in 3 months.  If my schedule is not open yet, we will contact you with a reminder closer to that time. Please call [phone] if you haven't heard from us  a month before, and always call us  sooner if issues or concerns arise. You can also send us  a message through MyChart, but but aware that this is not to be used for urgent issues and it may take up to 5-7 days to receive a reply. Please be aware that you will likely be able to view your results before I have a chance to respond to them. Please give us  5 business days to respond to any non-urgent results.    Before your next visit I would like you to have:  Your sleep study shows severe sleep apnea.  I am ordering a CPAP titration study.  This is an in lab study to help titrate exactly how much CPAP or BiPAP you might need.  I will call you with the results of this and prescribe PAP therapy.  You and I need to follow-up between 31 and 92 days of using your CPAP or BiPAP so that I can document adherence and efficacy.  Lets start a maintenance inhaler for your asthma since you are having ongoing symptoms of shortness of breath.  Start Dulera 2 puffs in the morning 2 puffs at night, gargle after use.  Continue the albuterol  inhaler as needed  For your allergies stop the Claritin  and switch to cetirizine .  This can make you sleepy so take it at nighttime.  I am also adding montelukast  or Singulair  this is a once daily allergy medicine that is prescription only.  You should take this in the morning.  The lung nodule that was seen previously has resolved.  No further follow-up is needed for this.  Congratulations on staying off cigarettes!!! That is awesome!

## 2023-11-10 NOTE — Patient Instructions (Signed)
 For allergies please continue Claritin  10 mg daily,  use Flonase  nasal spray, 2 spray into both nostrils daily  1. OSA (obstructive sleep apnea) (Primary)   2. Morbid obesity (HCC)  - Semaglutide -Weight Management (WEGOVY ) 1.7 MG/0.75ML SOAJ; Inject 1.7 mg into the skin once a week.  Dispense: 3 mL; Refill: 0  3. Primary hypertension  - amLODipine  (NORVASC ) 5 MG tablet; Take 1 tablet (5 mg total) by mouth daily.  Dispense: 90 tablet; Refill: 1 - hydrochlorothiazide  (HYDRODIURIL ) 12.5 MG tablet; Take 1 tablet (12.5 mg total) by mouth daily.  Dispense: 90 tablet; Refill: 1  4. Anxiety and depression  - buPROPion  (WELLBUTRIN  SR) 150 MG 12 hr tablet; Take  150 mg once daily for 3 days; increase to 150 mg twice daily  Dispense: 60 tablet; Refill: 3  5. Screening for cervical cancer  - Ambulatory referral to Gynecology  6. Screening mammogram for breast cancer  - MM Digital Screening; Future Please call [phone]   to schedule your mammogram.  [facility]. [address] [place], [place] [address]. [place] .    7. Tobacco use  - buPROPion  (WELLBUTRIN  SR) 150 MG 12 hr tablet; Take  150 mg once daily for 3 days; increase to 150 mg twice daily  Dispense: 60 tablet; Refill: 3  8. Allergic rhinitis, unspecified seasonality, unspecified trigger  - fluticasone  (FLONASE ) 50 MCG/ACT nasal spray; Place 2 sprays into both nostrils daily.  Dispense: 16 g; Refill: 6    It is important that you exercise regularly at least 30 minutes 5 times a week as tolerated  Think about what you will eat, plan ahead. Choose  clean, green, fresh or frozen over canned, processed or packaged foods which are more sugary, salty and fatty. 70 to 75% of food eaten should be vegetables and fruit. Three meals at set times with snacks allowed between meals, but they must be fruit or vegetables. Aim to eat over a 12 hour period , example 7 am to 7 pm, and STOP after  your  last meal of the day. Drink water,generally about 64 ounces per day, no other drink is as healthy. Fruit juice is best enjoyed in a healthy way, by EATING the fruit.  Thanks for choosing [facility] we consider it a privelige to serve you.

## 2023-11-10 NOTE — Assessment & Plan Note (Signed)
 Taking Claritin  10 mg but her allergy symptoms is not well-controlled Has not been using Flonase  ,patient encouraged to consider using Flonase  2 spray into both nostrils daily Patient referred to allergist

## 2023-11-10 NOTE — Assessment & Plan Note (Addendum)
 Dr Neysa Suggest CPAP titration sleep study, which would allow transition to BIPAP if needed and would also document for insurance if supplemental O2 is needed Will refer patient to [facility]

## 2023-11-10 NOTE — Progress Notes (Signed)
 Rose Gutierrez    [id number]    1980-11-19  Primary Care Physician:Paseda, Folashade R, FNP Date of Appointment: 11/10/2023 Established Patient Visit  Chief complaint:   Chief Complaint  Patient presents with   Follow-up    Pft     HPI: Rose Gutierrez is a 43 y.o. woman with shortness of breath and sleep apnea. Had a solitary pulmonary nodule which was needin repeat imaging. Also tobacco use disorder with recent cessation as well as childhood asthma.   Interval Updates: Here for follow up after PFTs. Spirometry was normal.   Sleep study - severe osa AH >102/hr  Continues to feel daily fatigue and dyspena.   Uses albuterol  inhaler about twice a week.  Daily symptoms of dyspnea on exertion with Chest tightness, wheezing.   Frequent sneezing and sinus congestion. Feels allergies poorly controlled on claritin  alone.   I have reviewed the patient's family social and past medical history and updated as appropriate.   Past Medical History:  Diagnosis Date   Anxiety    Asthma    Chlamydia    Depression    Hypertension    Sleep apnea    Suicidal ideation    Trichomonas infection     Past Surgical History:  Procedure Laterality Date   CESAREAN SECTION     cesearean section      Family History  Problem Relation Age of Onset   Cancer Mother        breast   Asthma Neg Hx     Social History   Occupational History   Not on file  Tobacco Use   Smoking status: Former    Current packs/day: 0.00    Average packs/day: 1 pack/day for 25.4 years (25.4 ttl pk-yrs)    Types: Cigarettes    Start date: 2000    Quit date: 09/04/2023    Years since quitting: 0.1   Smokeless tobacco: Never  Substance and Sexual Activity   Alcohol use: Yes    Comment: occasionally    Drug use: No   Sexual activity: Yes    Birth control/protection: None     Physical Exam: Blood pressure 124/80, pulse 90, height 5' 6 (1.676 m), weight (!) 323 lb (146.5 kg), SpO2  95%.  Gen:      No acute distress, obese ENT:  no nasal polyps, mucus membranes moist Lungs:    No increased respiratory effort, symmetric chest wall excursion, clear to auscultation bilaterally, no wheezes or crackles CV:         Regular rate and rhythm; no murmurs, rubs, or gallops.  No pedal edema   Data Reviewed: Imaging: I have personally reviewed the ct Chest June 2025 shows resolved ground glass nodule  PFTs:     Latest Ref Rng & Units 11/10/2023    3:33 PM  PFT Results  FVC-Pre L 2.44  P  FVC-Predicted Pre % 61  P  Pre FEV1/FVC % % 85  P  FEV1-Pre L 2.07  P  FEV1-Predicted Pre % 64  P    P Preliminary result   I have personally reviewed the patient's PFTs and normal spirometry   Labs: Lab Results  Component Value Date   NA 140 10/13/2023   K 4.2 10/13/2023   CO2 23 10/13/2023   GLUCOSE 97 10/13/2023   BUN 13 10/13/2023   CREATININE 0.94 10/13/2023   CALCIUM 9.4 10/13/2023   EGFR 79 10/13/2023   GFRNONAA >60 09/05/2023  Lab Results  Component Value Date   WBC 7.6 09/05/2023   HGB 11.0 (L) 09/05/2023   HCT 36.8 09/05/2023   MCV 90.0 09/05/2023   PLT 389 09/05/2023    Immunization status: Immunization History  Administered Date(s) Administered   HPV 9-valent 12/03/2017, 02/06/2018, 06/04/2018   HPV Quadrivalent 06/04/2018   Tdap 12/03/2017    External Records Personally Reviewed: sleep, primary care  Assessment:  Severe sleep apnea AHI >102 Moderate persistent asthma not well controlled Seasonal allergic rhinitis not well controlled Solitary pulmonary nodule History of smoking, quit 2025  Plan/Recommendations: Your sleep study shows severe sleep apnea.  I am ordering a CPAP titration study.  This is an in lab study to help titrate exactly how much CPAP or BiPAP you might need.  I will call you with the results of this and prescribe PAP therapy.  You and I need to follow-up between 31 and 92 days of using your CPAP or BiPAP so that I can  document adherence and efficacy.  Lets start a maintenance inhaler for your asthma since you are having ongoing symptoms of shortness of breath.  Start Dulera 2 puffs in the morning 2 puffs at night, gargle after use.  Continue the albuterol  inhaler as needed  For your allergies stop the Claritin  and switch to cetirizine .  This can make you sleepy so take it at nighttime.  I am also adding montelukast  or Singulair  this is a once daily allergy medicine that is prescription only.  You should take this in the morning.  The lung nodule that was seen previously has resolved.  No further follow-up is needed for this.  Congratulations on staying off cigarettes!!! That is awesome!   Return to Care: No follow-ups on file.   Verdon Gore, MD Pulmonary and Critical Care Medicine [facility] [phone]

## 2023-11-10 NOTE — Assessment & Plan Note (Signed)
 Currently not smoking Continue Wellbutrin  150 mg twice daily

## 2023-11-10 NOTE — Assessment & Plan Note (Addendum)
 Controlled on amlodipine  5 mg daily, hydrochlorothiazide  12.5 mg daily  Continue current medications  DASH diet and commitment to daily physical activity for a minimum of 30 minutes discussed and encouraged, as a part of hypertension management. The importance of attaining a healthy weight is also discussed.     11/10/2023    2:29 PM 09/29/2023   10:37 AM 09/11/2023    8:23 AM 09/10/2023    4:12 PM 09/05/2023    2:13 PM 09/05/2023   10:34 AM 09/05/2023   10:25 AM  BP/Weight  Systolic BP 114  856  148  147  Diastolic BP 69  93  94  115  Wt. (Lbs) 323.4 339 339.8 334  338   BMI 52.2 kg/m2 54.72 kg/m2 54.85 kg/m2 53.91 kg/m2  58.02 kg/m2

## 2023-11-10 NOTE — Patient Instructions (Signed)
Spirometry performed today. 

## 2023-11-10 NOTE — Progress Notes (Signed)
 Patient late for appointment Spirometry only performed today.

## 2023-11-10 NOTE — Assessment & Plan Note (Addendum)
 Wt Readings from Last 3 Encounters:  11/10/23 (!) 323 lb 6.4 oz (146.7 kg)  09/29/23 (!) 339 lb (153.8 kg)  09/11/23 (!) 339 lb 12.8 oz (154.1 kg)   Body mass index is 52.2 kg/m.  Doing well on Wegovy  1 mg once weekly injection She denies any adverse reactions to the medication She has lost 16 pounds since she started Wegovy  She plans to l start going to the [facility] next week, will be seeing a nutritionist this month Does a lot of walking  at work, not doing much fried foods, cutting down on rice, does protion control, trying to avoid weight loss surgery Increase Wegovy  to 1.7 mg once weekly Patient counseled on low-carb diet, encouraged to engage in regular moderate to vigorous exercise at least 150 minutes weekly as tolerated, follow-up with a nutritionist as planned Follow-up in the office in 3 months.

## 2023-11-11 ENCOUNTER — Telehealth: Payer: Self-pay | Admitting: Nurse Practitioner

## 2023-11-11 ENCOUNTER — Other Ambulatory Visit: Payer: Self-pay

## 2023-11-11 DIAGNOSIS — G4733 Obstructive sleep apnea (adult) (pediatric): Secondary | ICD-10-CM

## 2023-11-11 NOTE — Telephone Encounter (Unsigned)
 Copied from CRM [id number]. Topic: General - Call Back - No Documentation >> Nov 11, 2023  4:25 PM Donee H wrote: Reason for CRM: Patient stated was returning miss call from today. Did not see any documentation for nature of call. Please follow back up with patient at [phone]

## 2023-11-11 NOTE — Addendum Note (Signed)
 Addended by: VICTORY IHA on: 11/11/2023 12:25 PM   Modules accepted: Orders

## 2023-11-11 NOTE — Progress Notes (Signed)
 This encounter was created in error - please disregard.

## 2023-11-12 ENCOUNTER — Telehealth: Payer: Self-pay | Admitting: Nurse Practitioner

## 2023-11-12 NOTE — Telephone Encounter (Signed)
 Patient stated was returning miss call from today. Did not see any documentation for nature of call. Please follow back up with patient at [phone]

## 2023-11-12 NOTE — Telephone Encounter (Signed)
 Message was sent to pt my chart and lvm . KH

## 2023-11-25 ENCOUNTER — Other Ambulatory Visit: Payer: Self-pay

## 2023-11-26 ENCOUNTER — Ambulatory Visit
Admission: RE | Admit: 2023-11-26 | Discharge: 2023-11-26 | Disposition: A | Discharge status: Home / Self Care | Source: Ambulatory Visit | Attending: Nurse Practitioner

## 2023-11-26 DIAGNOSIS — Z1231 Encounter for screening mammogram for malignant neoplasm of breast: Secondary | ICD-10-CM

## 2023-11-28 ENCOUNTER — Ambulatory Visit: Payer: Self-pay | Admitting: Nurse Practitioner

## 2023-12-02 DIAGNOSIS — Z419 Encounter for procedure for purposes other than remedying health state, unspecified: Secondary | ICD-10-CM | POA: Diagnosis not present

## 2023-12-05 ENCOUNTER — Ambulatory Visit: Payer: Self-pay | Admitting: Internal Medicine

## 2023-12-10 ENCOUNTER — Ambulatory Visit: Admitting: Nurse Practitioner

## 2023-12-10 ENCOUNTER — Ambulatory Visit (HOSPITAL_BASED_OUTPATIENT_CLINIC_OR_DEPARTMENT_OTHER): Attending: Internal Medicine | Admitting: Internal Medicine

## 2023-12-10 VITALS — Ht 66.0 in | Wt 319.0 lb

## 2023-12-10 DIAGNOSIS — G4733 Obstructive sleep apnea (adult) (pediatric): Secondary | ICD-10-CM | POA: Insufficient documentation

## 2023-12-11 ENCOUNTER — Other Ambulatory Visit: Payer: Self-pay | Admitting: Nurse Practitioner

## 2023-12-11 DIAGNOSIS — F32A Depression, unspecified: Secondary | ICD-10-CM

## 2023-12-24 ENCOUNTER — Ambulatory Visit (INDEPENDENT_AMBULATORY_CARE_PROVIDER_SITE_OTHER): Admitting: Allergy

## 2023-12-24 ENCOUNTER — Other Ambulatory Visit: Payer: Self-pay

## 2023-12-24 ENCOUNTER — Encounter: Payer: Self-pay | Admitting: Allergy

## 2023-12-24 VITALS — BP 114/86 | HR 83 | Temp 98.2°F | Resp 18 | Ht 65.75 in | Wt 318.6 lb

## 2023-12-24 DIAGNOSIS — J3089 Other allergic rhinitis: Secondary | ICD-10-CM

## 2023-12-24 DIAGNOSIS — J4540 Moderate persistent asthma, uncomplicated: Secondary | ICD-10-CM

## 2023-12-24 DIAGNOSIS — H1013 Acute atopic conjunctivitis, bilateral: Secondary | ICD-10-CM

## 2023-12-24 DIAGNOSIS — Z91038 Other insect allergy status: Secondary | ICD-10-CM | POA: Diagnosis not present

## 2023-12-24 MED ORDER — LEVOCETIRIZINE DIHYDROCHLORIDE 5 MG PO TABS: 5.0000 mg | ORAL_TABLET | Freq: Every evening | ORAL | 5 refills | Status: AC

## 2023-12-24 MED ORDER — MOMETASONE FUROATE 50 MCG/ACT NA SUSP: 1.0000 | Freq: Every day | NASAL | 5 refills | Status: AC | PRN

## 2023-12-24 NOTE — Progress Notes (Signed)
 New Patient Note  RE: Rose Gutierrez MRN: [id number] DOB: 1980-06-18 Date of Office Visit: 12/24/2023  Consult requested by: Paseda, Folashade R, FNP Primary care provider: Paseda, Folashade R, FNP  Chief Complaint: Establish Care (She presents for allergy. She says she sneezing really bad when smell something strong and she had sinus infection. Sometime, she feels SOB, coughing and wheezing )  History of Present Illness: I had the pleasure of seeing Rose Gutierrez for initial evaluation at the [facility] on 12/24/2023. She is a 43 y.o. female, who is referred here by Paseda, Folashade R, FNP for the evaluation of allergic rhinitis.  Discussed the use of AI scribe software for clinical note transcription with the patient, who gave verbal consent to proceed.    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 She has been on Zyrtec  and Singulair  since July, but they have not been effective. She has been unable to take Zyrtec  recently due to a refill issue and continues to use Singulair  and Flonase , the latter at two sprays per nostril daily, without relief.  She frequently experiences sinus infections, which resolve on their own without antibiotics. Her symptoms include sneezing, runny nose, itchy and watery eyes, and nasal congestion, worsening and occurring year-round. She has not undergone allergy testing before and has not seen an ENT specialist. She uses cromolyn eye drops as needed as well but has dry eyes.  She has a history of asthma, for which she uses Dulera, two puffs twice a day, and a rescue inhaler as needed, though she has not used it recently. She has a history of a lung nodule that resolved. She is followed by pulm.  She recalls a severe allergic reaction to a  bee sting in childhood, resulting in significant swelling. She has not had any subsequent stings. No current issues with hives or eczema, though she had eczema as a child.  Her social history reveals she is currently living out of her car and does not have pets. She is a former smoker. Her family history includes a brother with eczema and children with seasonal allergies.     She reports symptoms of sneezing, nasal congestion, rhinorrhea, PND, itchy/watery eyes. Symptoms have been going on for many years and worsening. The symptoms are present all year around.Anosmia: no. Headache: sometimes. She has used zyrtec , Singulair , Flonase , cromolyn with minimal improvement in symptoms. Sinus infections: yes. Previous work up includes: none. Previous ENT evaluation: no. Previous sinus imaging: no. History of nasal polyps: no. Last eye exam: 2025. History of reflux: sometimes.  11/10/2023 pulm visit: Assessment:  Severe sleep apnea AHI >102 Moderate persistent asthma not well controlled Seasonal allergic rhinitis not well controlled Solitary pulmonary nodule History of smoking, quit 2025   Plan/Recommendations: Your sleep study shows severe sleep apnea.  I am ordering a CPAP titration study.  This is an in lab study to help titrate exactly how much CPAP or BiPAP you might need.  I will call you with the results of this and prescribe PAP therapy.  You and I need to follow-up between 31 and 92 days of using your CPAP or BiPAP so that I can document adherence and efficacy.   Lets start a maintenance inhaler for your asthma since you are having ongoing symptoms of shortness of breath.  Start Dulera  2 puffs in the morning 2 puffs at night, gargle after use.  Continue the albuterol  inhaler as needed   For your allergies stop the Claritin  and switch to cetirizine .  This can make you sleepy so take it at nighttime.  I am also adding montelukast  or Singulair  this is a once daily allergy medicine that is  prescription only.  You should take this in the morning.   The lung nodule that was seen previously has resolved.  No further follow-up is needed for this.  09/19/2023 CT chest: IMPRESSION: Previously seen right lower lobe pulmonary nodule has resolved. No new or enlarging pulmonary nodules.   No acute cardiopulmonary disease.  Assessment and Plan: Rose Gutierrez is a 43 y.o. female with: Other allergic rhinitis Allergic conjunctivitis of both eyes Chronic allergic rhinitis with persistent symptoms despite current medications (flonase , zyrtec , Singulair ). No prior allergy testing. No prior ENT evaluation. Scents is a trigger. Works outdoors as Investment banker, corporate. Return for allergy skin testing (1-55). Will make additional recommendations based on results. Use Nasonex  (mometasone ) nasal spray 1-2 sprays per nostril once a day as needed for nasal congestion.  This replaces Flonase .  Nasal saline spray (i.e., Simply Saline) or nasal saline lavage (i.e., NeilMed) is recommended as needed and prior to medicated nasal sprays. Continue Singulair  (montelukast ) 10mg  daily at night. Take Xyzal  (levocetirizine) 5mg  daily. This replaces Zyrtec .  Hold 3 days before skin testing.   Possible hymenoptera allergy Facial swelling as a child. No prior work up. Continue to avoid. Consider bloodwork after skin testing.  Moderate persistent asthma without complication Asthma well-controlled with Dulera inhaler. No recent use of rescue inhaler. Follows with pulm  Today's spirometry showed some restriction most likely due to body habitus. Daily controller medication(s): continue Dulera 100mcg 2 puffs twice a day and rinse mouth afterwards. May use albuterol  rescue inhaler 2 puffs  every 4 to 6 hours as needed for shortness of breath, chest tightness, coughing, and wheezing.  Monitor frequency of use - if you need to use it more than twice per week on a consistent basis let us  know.   Return for Skin  testing.  Meds ordered this encounter  Medications   levocetirizine (XYZAL ) 5 MG tablet    Sig: Take 1 tablet (5 mg total) by mouth every evening.    Dispense:  30 tablet    Refill:  5   mometasone  (NASONEX ) 50 MCG/ACT nasal spray    Sig: Place 1-2 sprays into the nose daily as needed (nasal congestion).    Dispense:  1 each    Refill:  5   Lab Orders  No laboratory test(s) ordered today    Other allergy screening: Food allergy: no Medication allergy: no Hymenoptera allergy:  As a child had some type of facial swelling. Urticaria: no Eczema:no History of recurrent infections suggestive of immunodeficency: no  Diagnostics: Spirometry:  Tracings reviewed. Her effort: It was hard to get consistent efforts and there is a question as to whether this reflects a maximal maneuver. FVC: 2.32L FEV1: 1.67L, 61% predicted FEV1/FVC ratio: 72% Interpretation: Spirometry consistent with possible restrictive disease.  Please see scanned spirometry results for details.  Results discussed with patient/family.   Past Medical History: Patient Active Problem List   Diagnosis Date Noted   Bilateral lower extremity edema 09/29/2023   Chest pain 09/05/2023   Acute midline low back pain 08/25/2023   Anxiety and depression 07/14/2023   OSA (obstructive sleep apnea) 07/14/2023   Shortness of breath 07/14/2023  Allergic rhinitis 07/14/2023   Prediabetes 07/14/2023   Frequent noctunal urination 07/14/2023   Lung nodule 06/06/2023   Tobacco use 06/06/2023   Asthma exacerbation 06/06/2023   Sepsis due to pneumonia (HCC) 06/05/2023   Schizoaffective disorder, bipolar type (HCC) 08/27/2014   Suicidal ideation    Morbid obesity (HCC) 04/29/2014   HTN (hypertension) 04/29/2014   Labial cyst 04/29/2014   Past Medical History:  Diagnosis Date   Angio-edema    Anxiety    Asthma    Chlamydia    Depression    Eczema    Hypertension    Recurrent upper respiratory infection (URI)     Sleep apnea    Suicidal ideation    Trichomonas infection    Past Surgical History: Past Surgical History:  Procedure Laterality Date   CESAREAN SECTION     cesearean section     Medication List:  Current Outpatient Medications  Medication Sig Dispense Refill   albuterol  (VENTOLIN  HFA) 108 (90 Base) MCG/ACT inhaler Inhale 2 puffs into the lungs every 6 (six) hours as needed for wheezing or shortness of breath. 8 g 2   amLODipine  (NORVASC ) 5 MG tablet Take 1 tablet (5 mg total) by mouth daily. 90 tablet 1   Blood Pressure Monitoring (BLOOD PRESSURE KIT) DEVI 1 each by Does not apply route as directed. 1 each 0   buPROPion  (WELLBUTRIN  SR) 150 MG 12 hr tablet Take  150 mg once daily for 3 days; increase to 150 mg twice daily 60 tablet 3   busPIRone  (BUSPAR ) 5 MG tablet TAKE 1 TABLET(5 MG) BY MOUTH THREE TIMES DAILY 90 tablet 3   cromolyn (OPTICROM) 4 % ophthalmic solution Place 1 drop into both eyes in the morning and at bedtime.     hydrochlorothiazide  (HYDRODIURIL ) 12.5 MG tablet Take 1 tablet (12.5 mg total) by mouth daily. 90 tablet 1   levocetirizine (XYZAL ) 5 MG tablet Take 1 tablet (5 mg total) by mouth every evening. 30 tablet 5   mometasone  (NASONEX ) 50 MCG/ACT nasal spray Place 1-2 sprays into the nose daily as needed (nasal congestion). 1 each 5   mometasone -formoterol  (DULERA) 100-5 MCG/ACT AERO Inhale 2 puffs into the lungs in the morning and at bedtime. 1 each 5   montelukast  (SINGULAIR ) 10 MG tablet Take 1 tablet (10 mg total) by mouth daily. 30 tablet 11   Semaglutide -Weight Management (WEGOVY ) 1.7 MG/0.75ML SOAJ Inject 1.7 mg into the skin once a week. 3 mL 0   Semaglutide -Weight Management 1 MG/0.5ML SOAJ Inject 1 mg into the skin once a week. 2 mL 0   XIIDRA 5 % SOLN      No current facility-administered medications for this visit.   Allergies: No Known Allergies Social History: Social History   Socioeconomic History   Marital status: Single    Spouse name: Not on  file   Number of children: 2   Years of education: Not on file   Highest education level: Not on file  Occupational History   Not on file  Tobacco Use   Smoking status: Former    Current packs/day: 0.00    Average packs/day: 1 pack/day for 25.4 years (25.4 ttl pk-yrs)    Types: Cigarettes    Start date: 2000    Quit date: 09/04/2023    Years since quitting: 0.3   Smokeless tobacco: Never  Substance and Sexual Activity   Alcohol use: Yes    Comment: occasionally    Drug use: No   Sexual activity:  Yes    Birth control/protection: None  Other Topics Concern   Not on file  Social History Narrative   [place]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    Social Drivers of Health   Financial Resource Strain: Not on file  Food Insecurity: Food Insecurity Present (06/05/2023)   Hunger Vital Sign    Worried About Running Out of Food in the Last Year: Sometimes true    Ran Out of Food in the Last Year: Sometimes true  Transportation Needs: No Transportation Needs (06/05/2023)   PRAPARE - Administrator, Civil Service (Medical): No    Lack of Transportation (Non-Medical): No  Physical Activity: Not on file  Stress: Not on file  Social Connections: Moderately Isolated (06/05/2023)   Social Connection and Isolation Panel    Frequency of Communication with Friends and Family: More than three times a week    Frequency of Social Gatherings with Friends and Family: More than three times a week    Attends Religious Services: More than 4 times per year    Active Member of [employer] or Organizations: No    Attends Banker Meetings: Never    Marital Status: Never married   Lives in a car. Smoking: denies Occupation: Special educational needs teacher.  Environmental History: Water Damage/mildew in the house: no Carpet in the family room: no Carpet in the bedroom: no Heating: none Cooling: non Pet: no  Family History: Family History  Problem Relation Age of Onset   Cancer  Mother        breast   Asthma Neg Hx    Problem                               Relation Asthma                                   no Eczema                                Brother  Food allergy                          no Allergic rhino conjunctivitis     children   Review of Systems  Constitutional:  Negative for appetite change, chills, fever and unexpected weight change.  HENT:  Positive for congestion, postnasal drip, rhinorrhea and sneezing.   Eyes:  Positive for itching.  Respiratory:  Negative for cough, chest tightness, shortness of breath and wheezing.   Cardiovascular:  Negative for chest pain.  Gastrointestinal:  Negative for abdominal pain.  Genitourinary:  Negative for difficulty urinating.  Skin:  Negative for rash.  Neurological:  Positive for headaches.    Objective: BP 114/86 (BP Location: Left Arm, Patient Position: Sitting, Cuff Size: Normal)   Pulse 83   Temp 98.2 F (36.8 C) (Temporal)   Resp 18   Ht 5' 5.75 (1.67 m)   Wt (!) 318 lb 9.6 oz (144.5 kg)   SpO2 98%   BMI 51.82 kg/m  Body mass index is 51.82 kg/m. Physical Exam Vitals and nursing note reviewed.  Constitutional:      Appearance: Normal appearance. She is well-developed. She is obese.  HENT:     Head: Normocephalic and atraumatic.  Right Ear: Tympanic membrane and external ear normal.     Left Ear: Tympanic membrane and external ear normal.     Nose:     Comments: Transverse nasal crease    Mouth/Throat:     Mouth: Mucous membranes are moist.     Pharynx: Oropharynx is clear.  Eyes:     Conjunctiva/sclera: Conjunctivae normal.  Cardiovascular:     Rate and Rhythm: Normal rate and regular rhythm.     Heart sounds: Normal heart sounds. No murmur heard.    No friction rub. No gallop.  Pulmonary:     Effort: Pulmonary effort is normal.     Breath sounds: Normal breath sounds. No wheezing, rhonchi or rales.  Musculoskeletal:     Cervical back: Neck supple.  Skin:    General: Skin  is warm.     Findings: No rash.  Neurological:     Mental Status: She is alert and oriented to person, place, and time.  Psychiatric:        Behavior: Behavior normal.    The plan was reviewed with the patient/family, and all questions/concerned were addressed.  It was my pleasure to see Rose Gutierrez today and participate in her care. Please feel free to contact me with any questions or concerns.  Sincerely,  Orlan Cramp, DO Allergy & Immunology  [facility]  [place] office: [phone] [facility] office: [phone]

## 2023-12-24 NOTE — Patient Instructions (Addendum)
 Rhinitis  Return for allergy skin testing. Will make additional recommendations based on results. Make sure you don't take any antihistamines for 3 days before the skin testing appointment. Don't put any lotion on the back and arms on the day of testing.  Must be in good health and not ill. No vaccines/injections/antibiotics within the past 7 days.  Plan on being here for 30-60 minutes.  Use Nasonex  (mometasone ) nasal spray 1-2 sprays per nostril once a day as needed for nasal congestion.  This replaces Flonase .  Nasal saline spray (i.e., Simply Saline) or nasal saline lavage (i.e., NeilMed) is recommended as needed and prior to medicated nasal sprays.  Continue Singulair  (montelukast ) 10mg  daily at night. Take Xyzal  (levocetirizine) 5mg  daily. This replaces Zyrtec .  Hold 3 days before skin testing.   Breathing Daily controller medication(s): continue Dulera 100mcg 2 puffs twice a day and rinse mouth afterwards. May use albuterol  rescue inhaler 2 puffs  every 4 to 6 hours as needed for shortness of breath, chest tightness, coughing, and wheezing.  Monitor frequency of use - if you need to use it more than twice per week on a consistent basis let us  know.  Breathing control goals:  Full participation in all desired activities (may need albuterol  before activity) Albuterol  use two times or less a week on average (not counting use with activity) Cough interfering with sleep two times or less a month Oral steroids no more than once a year No hospitalizations   Stinging insects Continue to avoid. Consider bloodwork after skin testing.  Follow up for skin testing.

## 2023-12-26 ENCOUNTER — Other Ambulatory Visit: Payer: Self-pay

## 2023-12-26 ENCOUNTER — Other Ambulatory Visit: Payer: Self-pay | Admitting: Nurse Practitioner

## 2023-12-26 DIAGNOSIS — I10 Essential (primary) hypertension: Secondary | ICD-10-CM

## 2023-12-26 MED ORDER — AMLODIPINE BESYLATE 5 MG PO TABS: 5.0000 mg | ORAL_TABLET | Freq: Every day | ORAL | 1 refills | Status: DC

## 2023-12-26 MED ORDER — SEMAGLUTIDE-WEIGHT MANAGEMENT 2.4 MG/0.75ML ~~LOC~~ SOAJ: 2.4000 mg | SUBCUTANEOUS | 3 refills | Status: DC

## 2023-12-26 NOTE — Telephone Encounter (Signed)
 Please advise [facility]

## 2023-12-27 NOTE — Procedures (Signed)
 [facility] [address] [place], [place] [address] Tel: [phone]   Fax: [phone]  Titration Interpretation  Patient Name:  Rose Gutierrez, Rose Gutierrez Date:  12/10/2023 Referring Physician:  VERDON GORE [phone]) %%startinterp%% Indications for Polysomnography The patient is a 43 year old Female who is 5' 6 and weighs 319.0 lbs. Her BMI equals 51.9.  A full night titration treatment study was performed. HST 09/11/23- AHI(4%) 102.7/hr. Desaturation to 55%/mean 88.1%, body weight 338 lbs  Medication  Cetirizine   Buspirone   Bupropion  SR   Polysomnogram Data A full night polysomnogram recorded the standard physiologic parameters including EEG, EOG, EMG, EKG, nasal and oral airflow.  Respiratory parameters of chest and abdominal movements were recorded with Respiratory Inductance Plethysmography belts.  Oxygen saturation was recorded by pulse oximetry.   Sleep Architecture The total recording time of the polysomnogram was 368.3 minutes.  The total sleep time was 247.5 minutes.  The patient spent 13.9% of total sleep time in Stage N1, 47.9% in Stage N2, 0.8% in Stages N3, and 37.4% in REM.  Sleep latency was 43.9 minutes.  REM latency was 111.5 minutes.  Sleep Efficiency was 67.2%.  Wake after Sleep Onset time was 76.5 minutes.  Titration Summary The patient was titrated at pressures ranging from 8/2* cm/H20 with supplemental oxygen at - up to 27/23/0** cm/H20 with supplemental oxygen at -.  The last pressure used in the study was 27/23/0** cm/H20 with supplemental oxygen at -.  Respiratory Events The polysomnogram revealed a presence of 62 obstructive, 2 centrals, and - mixed apneas resulting in an Apnea index of 15.5 events per hour.  There were 132 hypopneas (>=3% desaturation and/or arousal) resulting in an Apnea\Hypopnea Index (AHI >=3% desaturation and/or arousal) of 47.5 events per hour.  There were 92 hypopneas (>=4% desaturation)  resulting in an Apnea\Hypopnea Index (AHI >=4% desaturation) of 37.8 events per hour.  There were 16 Respiratory Effort Related Arousals resulting in a RERA index of 3.9 events per hour. The Respiratory Disturbance Index is 51.4 events per hour.  The snore index was - events per hour.  Mean oxygen saturation was 94.8%.  The lowest oxygen saturation during sleep was 59.0%.  Time spent <=88% oxygen saturation was 22.8 minutes (6.5%).  Limb Activity There were - limb movements recorded.  Of this total, - were classified as PLMs.  Of the PLMs, - were associated with arousals.  The Limb Movement index was - per hour while the PLM index was - per hour.  Cardiac Summary The average pulse rate was 91.9 bpm.  The minimum pulse rate was 75.0 bpm while the maximum pulse rate was 115.0 bpm.  Cardiac rhythm was normal.  Comments: CPAP provided inadequate control and was converted to BIPAP titration.  BIPAP 24/20 left residual AHI (4%) 16.8/hr with saturation nadir 88%, mean 95.3%. Maximum available Bilevel pressure is 25/ 25 cwp. Recommend trial with BiPAP 25/22, or autoBIPAP.  Diagnosis: Obstructive sleep apnea  Recommendations:  Suggest trial BIPAP 25/22, PS 0. Patient wore a medium ResMed AirFit F-20 full-face mask with heated humidification.  This study was personally reviewed and electronically signed by: KIMBERLEY Salt, MD Accredited Board Certified in Sleep Medicine Date/Time: 12/27/23  12:17    %%endinterp%%  Titration Report  Patient Name: Rose Gutierrez, Rose Gutierrez Date: 12/10/2023  Date of Birth: November 08, 1980 Study Type: CPAP Titration  Age: 43 year MRN #: [id number]  Sex: Female Interpreting Physician: -  Height: 5' 6 Referring Physician: VERDON GORE [phone])  Weight:  319.0 lbs Recording Tech: Orie Spruill RRT RPSGT RST  BMI: 51.9 Scoring Tech: Orie Sires RRT RPSGT RST  ESS: 22 Neck Size: 16  Mask Type ResMed F-30 Final Pressure: 27/23 CMH2O  Mask Size: Medium Supplemental O2: -   Study  Overview  Lights Off: 10:56:36 PM  Count Index  Lights On: 05:04:54 AM Awakenings: 35 8.5  Time in Bed: 368.3 min. Arousals: 98 23.8  Total Sleep Time: 247.5 min. AHI (>=3% Desat and/or Ar.): 196 47.5   Sleep Efficiency: 67.2% AHI (>=4% Desat): 156 37.8   Sleep Latency: 43.9 min. Limb Movements: - -  Wake After Sleep Onset: 76.5 min. Snore: - -  REM Latency from Sleep Onset: 111.5 min. Desaturations: 188 45.6     Minimum SpO2 TST: 59.0%    Sleep Architecture  % of Time in Bed Stages Time (mins) % Sleep Time  Wake 121.0   Stage N1 34.5 13.9%  Stage N2 118.5 47.9%  Stage N3 2.0 0.8%  REM 92.5 37.4%   Arousal Summary   NREM REM Sleep Index  Respiratory Arousals 64 3 67 16.2  PLM Arousals - - - -  Isolated Limb Movement Arousals - - - -  Snore Arousals - - - -  Spontaneous Arousals 26 5 31  7.5  Total 90 8 98 23.8   Limb Movement Summary   Count Index  Isolated Limb Movements - -  Periodic Limb Movements (PLMs) - -  Total Limb Movements - -    Respiratory Summary   By Sleep Stage By Body Position Total   NREM REM Supine Non-Supine   Time (min) 155.0 92.5 149.0 98.5 247.5         Obstructive Apnea 54 8 7 55 62  Mixed Apnea - - - - -  Central Apnea 2 - 2 - 2  Total Apneas 56 8 9 55 64  Total Apnea Index 21.7 5.2 3.6 33.5 15.5         Hypopneas (>=3% Desat and/or Ar.) 89 43 48 84 132  AHI (>=3% Desat and/or Ar.) 56.1 33.1 23.0 84.7 47.5         Hypopneas (>=4% Desat) 55 37 23 69 92  AHI (>=4% Desat) 43.0 29.2 12.9 75.5 37.8          RERAs 16 - 14 2 16   RERA Index 6.2 - 5.6 1.2 3.9         RDI 62.3 33.1 28.6 85.9 51.4     Respiratory Event Durations   Apnea Hypopnea   NREM REM NREM REM  Average (seconds) 15.6 23.9 17.2 23.5  Maximum (seconds) 23.7 38.5 56.9 77.3    Oxygen Saturation Summary   Wake NREM REM TST TIB  Average SpO2 96.4% 94.3% 93.9% 94.2% 94.8%  Minimum SpO2 83.0% 78.0% 59.0% 59.0% 59.0%  Maximum SpO2 100.0% 99.0% 99.0% 99.0% 100.0%    Oxygen Saturation Distribution  Range (%) Time in range (min) Time in range (%)  90.0 - 100.0 316.2 89.6%  80.0 - 90.0 30.2 8.6%  70.0 - 80.0 5.0 1.4%  60.0 - 70.0 1.4 0.4%  50.0 - 60.0 0.3 0.1%  0.0 - 50.0 - -  Time Spent <=88% SpO2  Range (%) Time in range (min) Time in range (%)  0.0 - 88.0 22.8 6.5%      Count Index  Desaturations 188 45.6    Cardiac Summary   Wake NREM REM Sleep Total  Average Pulse Rate (BPM) 89.0 93.2 93.0 93.1 91.9  Minimum  Pulse Rate (BPM) 75.0 77.0 78.0 77.0 75.0  Maximum Pulse Rate (BPM) 105.0 109.0 115.0 115.0 115.0   Pulse Rate Distribution:  Range (bpm) Time in range (min) Time in range (%)  0.0 - 40.0 - -  40.0 - 60.0 - -  60.0 - 80.0 8.3 2.3%  80.0 - 100.0 281.9 79.7%  100.0 - 120.0 51.1 14.5%  120.0 - 140.0 - -  140.0 - 200.0 - -   Titration Summary  PAP Device PAP Level O2 Level Time (min) Wake (min) NREM (min) REM (min) Sleep Eff% OA# CA# MA# Hyp# (>=3%) AHI (>=3%) Hyp# (>=4%) AHI (>=%4) RERA RDI SpO2 <=88% (min) Min SpO2 Mean SpO2 Ar. Index  CPAP EPR 8/2 - 96.0 74.5 21.5 0.0 22.4% 19 - - 22 114.4 8  75.3 8  136.7  0.2 88.0 95.3 67.0  CPAP EPR 10/2 - 16.5 3.0 13.5 0.0 81.8% 19 - - 10 128.9 9  124.4 -  128.9  1.4 84.0 92.8 57.8  CPAP EPR 12/2 - 10.5 0.0 10.5 0.0 100.0% 13 - - 9 125.7 8  120.0 -  125.7  3.1 78.0 91.1 74.3  CPAP EPR 14/2 - 16.0 0.0 16.0 0.0 100.0% - - - 19 71.3 14  52.5 1  75.0  2.7 81.0 90.3 22.5  CPAP EPR 16/2 - 24.5 0.0 16.5 8.0 100.0% 6 - - 10 39.2 7  31.8 1  41.6  4.2 59.0 90.5 12.2  CPAP EPR 18/2 - 37.0 8.5 3.0 25.5 77.0% 2 - - 33 73.7 27  61.1 2  77.9  10.7 60.0 89.6 6.3  CPAP EPR 20/2 - 43.0 20.0 23.0 0.0 53.5% 1 1 - 11 33.9 6  20.9 4  44.3  0.0 90.0 95.6 28.7  BiLevel 24/20/0 - 64.0 10.5 43.0 10.5 83.6% 2 1 - 15 20.2 12  16.8 -  20.2  0.2 88.0 95.3 19.1  BiLevel 26/22/0 - 22.0 0.0 0.0 22.0 100.0% - - - 1 2.7 -  - -  2.7  0.0 94.0 97.6 -  BiLevel 27/23/0 - 39.0 4.5 8.0 26.5 88.5% - - - 2 3.5 1  1.7 -  3.5   0.0 95.0 98.2 10.4    Hypnograms                           Technologist Comments  Patient was ordered as a CPAP titration. Patient is a 34- year- old [place] American female who was sent to the sleep center for OSA. Patient was placed on CPAP of 6 CMH2O at 10:56 pm and was increased to a Bilevel pressure of 27/23 CMH2O with no audible snoring noted. Patient was increased for respiratory events with and without a 4% Desat or arousal, and audible snoring. Patient tolerated CPAP/Bilevel trial fairly well. No oxygen was applied. Patient reported taking her medications at 9:50 pm. Patient study was performed in room # 3. No PLM's/PLMA's were noted. No obvious cardiac arrhythmias were noted during the night. Two restroom visits were noted.  Also, note patient arrived at the sleep center doing a lot of coughing and sneezing when asked if patient was sick patient stated that she has bad sinus and allergies and this was her normal. Thus, patient was noted to cough and sneeze a lot during the night and had noted congestion in am. Patient was fitted with a ResMed Airfit F-20 nasal/oral full face mask size medium with heated humidification.  Reggy Salt Diplomate, Biomedical engineer of Sleep Medicine  ELECTRONICALLY SIGNED ON:  12/27/2023, 12:01 PM [facility] PH: [phone]   FX: [phone] ACCREDITED BY THE AMERICAN ACADEMY OF SLEEP MEDICINE

## 2023-12-27 NOTE — Procedures (Signed)
  Indications for Polysomnography The patient is a 43 year old Female who is 5' 6 and weighs 319.0 lbs. Her BMI equals 51.9.  A full night titration treatment study was performed.  MedicationCetirizineBuspironeBupropion SR Polysomnogram Data A full night polysomnogram recorded the standard physiologic parameters including EEG, EOG, EMG, EKG, nasal and oral airflow.  Respiratory parameters of chest and abdominal movements were recorded with Respiratory Inductance Plethysmography belts.   Oxygen saturation was recorded by pulse oximetry.  Sleep Architecture The total recording time of the polysomnogram was 368.3 minutes.  The total sleep time was 247.5 minutes.  The patient spent 13.9% of total sleep time in Stage N1, 47.9% in Stage N2, 0.8% in Stages N3, and 37.4% in REM.  Sleep latency was 43.9 minutes.   REM latency was 111.5 minutes.  Sleep Efficiency was 67.2%.  Wake after Sleep Onset time was 76.5 minutes.  Titration Summary The patient was titrated at pressures ranging from 8/2* cm/H20 with supplemental oxygen at - up to 27/23/0** cm/H20 with supplemental oxygen at -.  The last pressure used in the study was 27/23/0** cm/H20 with supplemental oxygen at -.  Respiratory Events The polysomnogram revealed a presence of 62 obstructive, 2 centrals, and - mixed apneas resulting in an Apnea index of 15.5 events per hour.  There were 132 hypopneas (GreaterEqual to3% desaturation and/or arousal) resulting in an Apnea\Hypopnea Index  (AHI GreaterEqual to3% desaturation and/or arousal) of 47.5 events per hour.  There were 92 hypopneas (GreaterEqual to4% desaturation) resulting in an Apnea\Hypopnea Index (AHI GreaterEqual to4% desaturation) of 37.8 events per hour.  There were 16  Respiratory Effort Related Arousals resulting in a RERA index of 3.9 events per hour. The Respiratory Disturbance Index is 51.4 events per hour.  The snore index was - events per hour.  Mean oxygen saturation was 94.8%.  The  lowest oxygen saturation during sleep was 59.0%.  Time spent LessEqual to88% oxygen saturation was  minutes ().  Limb Activity There were - limb movements recorded.  Of this total, - were classified as PLMs.  Of the PLMs, - were associated with arousals.  The Limb Movement index was - per hour while the PLM index was - per hour.  Cardiac Summary The average pulse rate was 91.9 bpm.  The minimum pulse rate was 75.0 bpm while the maximum pulse rate was 115.0 bpm.  Cardiac rhythm was normal/abnormal.  Comments:  Diagnosis:  Recommendations:   This study was personally reviewed and electronically signed by: - Accredited Board Certified in Sleep Medicine Date/Time:

## 2023-12-29 ENCOUNTER — Telehealth: Payer: Self-pay | Admitting: Internal Medicine

## 2023-12-29 DIAGNOSIS — G4733 Obstructive sleep apnea (adult) (pediatric): Secondary | ICD-10-CM

## 2023-12-29 NOTE — Telephone Encounter (Signed)
 Sleep study consistent with severe sleep apnea.   Please prescribe autobipap IPAP 4-25 EPAP 4-25 with resmed medium facemask, tubing supplies.

## 2023-12-30 NOTE — Telephone Encounter (Signed)
 Called and spoke with patient, advised of results/recommendations per Dr. Meade.  She was advised to call the office to schedule an OV between day 31 and 90 of starting Bipap.  She verbalized understanding.  Bipap ordered.

## 2024-01-02 ENCOUNTER — Ambulatory Visit: Admitting: Allergy

## 2024-01-02 DIAGNOSIS — Z419 Encounter for procedure for purposes other than remedying health state, unspecified: Secondary | ICD-10-CM | POA: Diagnosis not present

## 2024-01-12 ENCOUNTER — Ambulatory Visit: Payer: Self-pay | Admitting: Internal Medicine

## 2024-01-12 DIAGNOSIS — G4733 Obstructive sleep apnea (adult) (pediatric): Secondary | ICD-10-CM

## 2024-01-12 NOTE — Telephone Encounter (Signed)
 FYI Only or Action Required?: Action required by provider: clinical question for provider.  Patient was last seen in primary care on 11/10/2023 by Paseda, Folashade R, FNP.  Called Nurse Triage reporting Medication Problem.   Triage Disposition: Call PCP Now  Patient/caregiver understands and will follow disposition?:      Copied from CRM [id number]. Topic: Clinical - Prescription Issue >> Jan 12, 2024 11:51 AM Essie A wrote: Reason for CRM: [facility] called to let the doctor know that the BiPap pressure settings are incorrect.  Tara called to get a revision on the pressure settings.  Please send by fax, [phone].  Thanks.    Reason for Disposition  [1] Caller requests to speak ONLY to PCP AND [2] URGENT question  Answer Assessment - Initial Assessment Questions 1. REASON FOR CALL or QUESTION: What is your reason for calling today? or How can I best     [facility] called stating the BiPAP pressure settings are incorrect and wanted to get a revision on the pressure settings.  2. CALLER: Document the source of call. (e.g., laboratory staff, caregiver or patient).     [facility], would like settings sent by fax to [phone]  Protocols used: PCP Call - No Triage-A-AH

## 2024-01-12 NOTE — Telephone Encounter (Signed)
 I called and spoke with Rose Gutierrez and he stated that the patient has not gotten the Bipap yet.  He also stated that there was clarification needed and a new order with the correct settings.  He stated that the IPAP and EPAP could not be the same setting.  Advised I would get clarification from another provider as Dr. Meade is currently out of the office for the week and then send in another order.  He verbalized understanding.    I spoke with Dr. Neysa who is the provider that read the study and he clarified that Dr. Meade wants auto Bipap 4-25,  EPAP= 4-25, PS=5.  New order sent to [facility].  Nothing further needed.

## 2024-01-26 ENCOUNTER — Other Ambulatory Visit: Payer: Self-pay | Admitting: Nurse Practitioner

## 2024-01-26 DIAGNOSIS — G4733 Obstructive sleep apnea (adult) (pediatric): Secondary | ICD-10-CM

## 2024-01-26 MED ORDER — TIRZEPATIDE-WEIGHT MANAGEMENT 2.5 MG/0.5ML ~~LOC~~ SOAJ: 2.5000 mg | SUBCUTANEOUS | 0 refills | Status: DC

## 2024-01-26 NOTE — Progress Notes (Unsigned)
 Rose Gutierrez

## 2024-01-27 ENCOUNTER — Telehealth: Payer: Self-pay

## 2024-01-27 ENCOUNTER — Other Ambulatory Visit: Payer: Self-pay

## 2024-01-27 NOTE — Telephone Encounter (Signed)
 Pharmacy Patient Advocate Encounter  Received notification from [facility] MEDICAID that Prior Authorization for ZEPBOUND has been APPROVED from 01/27/2024 to 07/25/2024   PA #/Case ID/Reference #: [id number]

## 2024-01-27 NOTE — Telephone Encounter (Signed)
 Pharmacy Patient Advocate Encounter   Received notification from CoverMyMeds that prior authorization for ZEPBOUND is required/requested.   Insurance verification completed.   The patient is insured through [facility] MEDICAID.   Per test claim: PA required; PA submitted to above mentioned insurance via CoverMyMeds Key/confirmation #/EOC [id number] Status is pending

## 2024-02-03 ENCOUNTER — Ambulatory Visit: Admitting: Internal Medicine

## 2024-02-11 ENCOUNTER — Encounter: Payer: Self-pay | Admitting: Nurse Practitioner

## 2024-02-11 ENCOUNTER — Ambulatory Visit: Payer: Self-pay | Admitting: Nurse Practitioner

## 2024-02-11 VITALS — BP 112/78 | HR 77 | Wt 309.0 lb

## 2024-02-11 DIAGNOSIS — Z13220 Encounter for screening for lipoid disorders: Secondary | ICD-10-CM | POA: Diagnosis not present

## 2024-02-11 DIAGNOSIS — F32A Depression, unspecified: Secondary | ICD-10-CM | POA: Insufficient documentation

## 2024-02-11 DIAGNOSIS — G4733 Obstructive sleep apnea (adult) (pediatric): Secondary | ICD-10-CM | POA: Diagnosis not present

## 2024-02-11 DIAGNOSIS — Z0000 Encounter for general adult medical examination without abnormal findings: Secondary | ICD-10-CM | POA: Diagnosis not present

## 2024-02-11 DIAGNOSIS — Z87891 Personal history of nicotine dependence: Secondary | ICD-10-CM

## 2024-02-11 DIAGNOSIS — F324 Major depressive disorder, single episode, in partial remission: Secondary | ICD-10-CM

## 2024-02-11 DIAGNOSIS — F418 Other specified anxiety disorders: Secondary | ICD-10-CM

## 2024-02-11 DIAGNOSIS — I10 Essential (primary) hypertension: Secondary | ICD-10-CM

## 2024-02-11 DIAGNOSIS — Z720 Tobacco use: Secondary | ICD-10-CM

## 2024-02-11 DIAGNOSIS — F419 Anxiety disorder, unspecified: Secondary | ICD-10-CM

## 2024-02-11 MED ORDER — ZEPBOUND 5 MG/0.5ML ~~LOC~~ SOAJ: 5.0000 mg | SUBCUTANEOUS | 0 refills | Status: DC

## 2024-02-11 MED ORDER — BUPROPION HCL ER (SR) 150 MG PO TB12: 150.0000 mg | ORAL_TABLET | Freq: Two times a day (BID) | ORAL | 1 refills | Status: DC

## 2024-02-11 NOTE — Assessment & Plan Note (Addendum)
 Wt Readings from Last 3 Encounters:  02/11/24 (!) 309 lb (140.2 kg)  12/24/23 (!) 318 lb 9.6 oz (144.5 kg)  12/10/23 (!) 319 lb (144.7 kg)   Morbid obesity with recent weight loss. On Semaglutide  for weight management. Discussed lifestyle changes and avoiding fatty foods to prevent side effects. Continue Zepbound 0.25 mg once weekly increased to 5 mg after completion - Advise avoiding fatty and fried foods. - Encourage regular exercise and low-carb diet

## 2024-02-11 NOTE — Assessment & Plan Note (Signed)
  Hypertension well-controlled with current medication. Blood pressure 112/78 mmHg. - Continue Amlodipine  5 mg oral daily. - Continue Hydrochlorothiazide  12.5 mg oral daily. - Encourage heart-healthy, low-salt, low-fat diet. - Encourage regular moderate to vigorous exercises 30 minutes 5 days weekly as tolerated

## 2024-02-11 NOTE — Progress Notes (Signed)
 Complete physical exam  Patient: Rose Gutierrez   DOB: 08/18/1980   43 y.o. Female  MRN: [id number]  Subjective:    Chief Complaint  Patient presents with   Annual Exam        Discussed the use of AI scribe software for clinical note transcription with the patient, who gave verbal consent to proceed.  History of Present Illness Rose Gutierrez is a 43 year old female  has a past medical history of Angio-edema, Anxiety, Asthma, Chlamydia, Depression, Eczema, Hypertension, Recurrent upper respiratory infection (URI), Sleep apnea, Suicidal ideation, and Trichomonas infection. who presents for an annual physical exam.  She is managing her hypertension with amlodipine  5 mg daily and hydrochlorothiazide  12.5 mg daily.   Anxiety and depression, on buspirone  5 mg 3 times daily she is also taking bupropion  150 mg every 12 hours. Recently, she received only a four-day supply of bupropion , which she found unusual.  She is concerned about weight management after her insurance stopped covering [employer] . She has been using Zepbound for weight management and has lost weight, going from 318 lbs to 309 lbs. Her diet has been inconsistent, with some days eating well and other days not eating at all. She is worried about regaining weight but is pleased with the progress so far. She has been on Zepbound for three weeks and took her third injection today.  She has not yet received her CPAP machine. No fever, chills, chest pain, shortness of breath, abdominal pain, nausea, or vomiting. She declined the flu vaccine. She is up to date with her breast cancer screening, which was normal. She has quit smoking completely. She has not eaten today and is prepared for fasting labs to check her blood count and lipid panel.   Assessment and Plan Assessment & Plan     Most recent fall risk assessment:    09/11/2023    8:21 AM  Fall Risk   Falls in the past year? 0  Number falls in past yr: 0  Injury with  Fall? 0     Most recent depression screenings:    02/11/2024    1:48 PM 11/10/2023    2:32 PM  PHQ 2/9 Scores  PHQ - 2 Score 0 2  PHQ- 9 Score 0 8        Patient Care Team: Sydelle Sherfield R, FNP as PCP - General (Nurse Practitioner)   Outpatient Medications Prior to Visit  Medication Sig   albuterol  (VENTOLIN  HFA) 108 (90 Base) MCG/ACT inhaler Inhale 2 puffs into the lungs every 6 (six) hours as needed for wheezing or shortness of breath.   amLODipine  (NORVASC ) 5 MG tablet Take 1 tablet (5 mg total) by mouth daily.   Blood Pressure Monitoring (BLOOD PRESSURE KIT) DEVI 1 each by Does not apply route as directed.   busPIRone  (BUSPAR ) 5 MG tablet TAKE 1 TABLET(5 MG) BY MOUTH THREE TIMES DAILY   cromolyn (OPTICROM) 4 % ophthalmic solution Place 1 drop into both eyes in the morning and at bedtime.   hydrochlorothiazide  (HYDRODIURIL ) 12.5 MG tablet Take 1 tablet (12.5 mg total) by mouth daily.   levocetirizine (XYZAL ) 5 MG tablet Take 1 tablet (5 mg total) by mouth every evening.   mometasone  (NASONEX ) 50 MCG/ACT nasal spray Place 1-2 sprays into the nose daily as needed (nasal congestion).   mometasone -formoterol  (DULERA) 100-5 MCG/ACT AERO Inhale 2 puffs into the lungs in the morning and at bedtime.   montelukast  (SINGULAIR ) 10 MG tablet Take 1 tablet (10  mg total) by mouth daily.   tirzepatide (ZEPBOUND) 2.5 MG/0.5ML Pen Inject 2.5 mg into the skin once a week.   XIIDRA 5 % SOLN    [DISCONTINUED] buPROPion  (WELLBUTRIN  SR) 150 MG 12 hr tablet Take  150 mg once daily for 3 days; increase to 150 mg twice daily   No facility-administered medications prior to visit.    Review of Systems  Constitutional:  Negative for appetite change, chills, fatigue and fever.  HENT:  Negative for congestion, postnasal drip, rhinorrhea and sneezing.   Eyes:  Negative for pain, discharge and itching.  Respiratory:  Negative for cough, shortness of breath and wheezing.   Cardiovascular:  Negative  for chest pain, palpitations and leg swelling.  Gastrointestinal:  Negative for abdominal pain, constipation, nausea and vomiting.  Endocrine: Negative for cold intolerance, heat intolerance and polydipsia.  Genitourinary:  Negative for difficulty urinating, dysuria, flank pain and frequency.  Musculoskeletal:  Negative for arthralgias, back pain, joint swelling and myalgias.  Skin:  Negative for color change, pallor, rash and wound.  Neurological:  Negative for dizziness, facial asymmetry, weakness, numbness and headaches.  Psychiatric/Behavioral:  Negative for behavioral problems, confusion, self-injury and suicidal ideas.        Objective:     BP 112/78   Pulse 77   Wt (!) 309 lb (140.2 kg)   SpO2 100%   BMI 50.25 kg/m    Physical Exam Vitals and nursing note reviewed.  Constitutional:      General: She is not in acute distress.    Appearance: Normal appearance. She is obese. She is not ill-appearing, toxic-appearing or diaphoretic.  HENT:     Right Ear: Tympanic membrane, ear canal and external ear normal. There is no impacted cerumen.     Left Ear: Tympanic membrane, ear canal and external ear normal. There is no impacted cerumen.     Nose: Nose normal. No congestion or rhinorrhea.     Mouth/Throat:     Mouth: Mucous membranes are moist.     Pharynx: Oropharynx is clear. No oropharyngeal exudate or posterior oropharyngeal erythema.  Eyes:     General: No scleral icterus.       Right eye: No discharge.        Left eye: No discharge.     Extraocular Movements: Extraocular movements intact.     Conjunctiva/sclera: Conjunctivae normal.  Neck:     Vascular: No carotid bruit.  Cardiovascular:     Rate and Rhythm: Normal rate and regular rhythm.     Pulses: Normal pulses.     Heart sounds: Normal heart sounds. No murmur heard.    No friction rub. No gallop.  Pulmonary:     Effort: Pulmonary effort is normal. No respiratory distress.     Breath sounds: Normal breath  sounds. No stridor. No wheezing, rhonchi or rales.  Chest:     Chest wall: No tenderness.  Abdominal:     General: Bowel sounds are normal. There is no distension.     Palpations: Abdomen is soft. There is no mass.     Tenderness: There is no abdominal tenderness. There is no right CVA tenderness, left CVA tenderness, guarding or rebound.     Hernia: No hernia is present.  Musculoskeletal:        General: No swelling, tenderness, deformity or signs of injury.     Cervical back: Normal range of motion and neck supple. No rigidity or tenderness.     Right lower leg: No edema.  Left lower leg: No edema.  Lymphadenopathy:     Cervical: No cervical adenopathy.  Skin:    General: Skin is warm and dry.     Capillary Refill: Capillary refill takes less than 2 seconds.     Coloration: Skin is not jaundiced or pale.     Findings: No bruising, erythema, lesion or rash.  Neurological:     Mental Status: She is alert and oriented to person, place, and time.     Cranial Nerves: No cranial nerve deficit.     Sensory: No sensory deficit.     Motor: No weakness.     Coordination: Coordination normal.     Gait: Gait normal.     Deep Tendon Reflexes: Reflexes normal.  Psychiatric:        Mood and Affect: Mood normal.        Behavior: Behavior normal.        Thought Content: Thought content normal.        Judgment: Judgment normal.     No results found for any visits on 02/11/24.     Assessment & Plan:    Routine Health Maintenance and Physical Exam  Immunization History  Administered Date(s) Administered   HPV 9-valent 12/03/2017, 02/06/2018, 06/04/2018   HPV Quadrivalent 06/04/2018   Tdap 12/03/2017    Health Maintenance  Topic Date Due   Hepatitis B Vaccines 19-59 Average Risk (1 of 3 - 19+ 3-dose series) Never done   Cervical Cancer Screening (HPV/Pap Cotest)  12/04/2022   COVID-19 Vaccine (1 - 2025-26 season) Never done   Influenza Vaccine  07/20/2024 (Originally 11/21/2023)    Pneumococcal Vaccine (1 of 2 - PCV) 09/10/2024 (Originally 01/30/2002)   Mammogram  11/25/2025   DTaP/Tdap/Td (2 - Td or Tdap) 12/04/2027   HPV VACCINES  Completed   Hepatitis C Screening  Completed   HIV Screening  Completed   Meningococcal B Vaccine  Aged Out    Discussed health benefits of physical activity, and encouraged her to engage in regular exercise appropriate for her age and condition.  Problem List Items Addressed This Visit       Cardiovascular and Mediastinum   HTN (hypertension)    Hypertension well-controlled with current medication. Blood pressure 112/78 mmHg. - Continue Amlodipine  5 mg oral daily. - Continue Hydrochlorothiazide  12.5 mg oral daily. - Encourage heart-healthy, low-salt, low-fat diet. - Encourage regular moderate to vigorous exercises 30 minutes 5 days weekly as tolerated        Relevant Orders   CBC   CMP14+EGFR     Respiratory   OSA (obstructive sleep apnea)    Obstructive sleep apnea with potential improvement due to weight loss. Awaiting CPAP machine delivery. - Follow up on CPAP machine delivery. - Encourage weight loss to reduce CPAP dependence.         Other   Morbid obesity (HCC)   Wt Readings from Last 3 Encounters:  02/11/24 (!) 309 lb (140.2 kg)  12/24/23 (!) 318 lb 9.6 oz (144.5 kg)  12/10/23 (!) 319 lb (144.7 kg)   Morbid obesity with recent weight loss. On Semaglutide  for weight management. Discussed lifestyle changes and avoiding fatty foods to prevent side effects. Continue Zepbound 0.25 mg once weekly increased to 5 mg after completion - Advise avoiding fatty and fried foods. - Encourage regular exercise and low-carb diet       Relevant Medications   tirzepatide (ZEPBOUND) 5 MG/0.5ML Pen   History of tobacco use disorder    Tobacco use,  in remission Tobacco use in remission. Quit smoking with Bupropion  aid.       Relevant Medications   buPROPion  (WELLBUTRIN  SR) 150 MG 12 hr tablet   Anxiety       02/11/2024    1:48 PM 11/10/2023    2:32 PM 09/29/2023   10:33 AM  Depression screen PHQ 2/9  Decreased Interest 0 0 0  Down, Depressed, Hopeless 0 2 0  PHQ - 2 Score 0 2 0  Altered sleeping 0 3 3  Tired, decreased energy 0 3 2  Change in appetite 0 0 0  Feeling bad or failure about yourself  0 0 0  Trouble concentrating 0 0 0  Moving slowly or fidgety/restless 0 0 0  Suicidal thoughts 0 0 0  PHQ-9 Score 0 8 5  Difficult doing work/chores Not difficult at all Not difficult at all Not difficult at all      02/11/2024    1:49 PM 11/10/2023    2:34 PM 09/29/2023   10:36 AM 09/05/2023    9:20 AM  GAD 7 : Generalized Anxiety Score  Nervous, Anxious, on Edge 1 1 1 1   Control/stop worrying 0 0 0 0  Worry too much - different things 0 0 1 0  Trouble relaxing 3 3 2 3   Restless 0 0 0 0  Easily annoyed or irritable 0 1 0 0  Afraid - awful might happen 0 0 0 0  Total GAD 7 Score 4 5 4 4   Anxiety Difficulty Not difficult at all Not difficult at all Not difficult at all Not difficult at all       Depression and anxiety Depression and anxiety managed with Bupropion  and Buspirone . Bupropion  aids smoking cessation. Prescription issue with Bupropion  addressed. - Continue Bupropion  150 mg oral twice daily. - Resend prescription for Bupropion . - Continue Buspirone  5 mg oral three times daily.        Relevant Medications   buPROPion  (WELLBUTRIN  SR) 150 MG 12 hr tablet   Annual physical exam - Primary    Routine wellness visit with well-controlled blood pressure. No new medication allergies. Declined flu vaccine despite recommendation. - Provide referral to gynecologist for Pap smear. - Encourage regular exercise: 30 minutes, 5 days a week. - Advise balanced diet: 70-80% vegetables and protein, less carbohydrates. - Encourage hydration: 64 ounces of water daily. - Advise seatbelt use in car. Up-to-date with mammogram       Depression      02/11/2024    1:48 PM 11/10/2023    2:32 PM  09/29/2023   10:33 AM 09/05/2023    9:21 AM 08/25/2023   11:56 AM  Depression screen PHQ 2/9  Decreased Interest 0 0 0 0 0  Down, Depressed, Hopeless 0 2 0 0 1  PHQ - 2 Score 0 2 0 0 1  Altered sleeping 0 3 3 3 3   Tired, decreased energy 0 3 2 3 3   Change in appetite 0 0 0 2 2  Feeling bad or failure about yourself  0 0 0 0 0  Trouble concentrating 0 0 0 1 1  Moving slowly or fidgety/restless 0 0 0 0 0  Suicidal thoughts 0 0 0 0 0  PHQ-9 Score 0 8 5 9 10   Difficult doing work/chores Not difficult at all Not difficult at all Not difficult at all Not difficult at all Somewhat difficult        Relevant Medications   buPROPion  (WELLBUTRIN  SR) 150 MG 12 hr  tablet   Other Visit Diagnoses       Encounter for screening for lipid disorder       Relevant Orders   Lipid panel      Return in about 2 months (around 04/12/2024) for OBESITY.     Jolicia Delira R Nakari Bracknell, FNP

## 2024-02-11 NOTE — Assessment & Plan Note (Addendum)
    02/11/2024    1:48 PM 11/10/2023    2:32 PM 09/29/2023   10:33 AM  Depression screen PHQ 2/9  Decreased Interest 0 0 0  Down, Depressed, Hopeless 0 2 0  PHQ - 2 Score 0 2 0  Altered sleeping 0 3 3  Tired, decreased energy 0 3 2  Change in appetite 0 0 0  Feeling bad or failure about yourself  0 0 0  Trouble concentrating 0 0 0  Moving slowly or fidgety/restless 0 0 0  Suicidal thoughts 0 0 0  PHQ-9 Score 0 8 5  Difficult doing work/chores Not difficult at all Not difficult at all Not difficult at all      02/11/2024    1:49 PM 11/10/2023    2:34 PM 09/29/2023   10:36 AM 09/05/2023    9:20 AM  GAD 7 : Generalized Anxiety Score  Nervous, Anxious, on Edge 1 1 1 1   Control/stop worrying 0 0 0 0  Worry too much - different things 0 0 1 0  Trouble relaxing 3 3 2 3   Restless 0 0 0 0  Easily annoyed or irritable 0 1 0 0  Afraid - awful might happen 0 0 0 0  Total GAD 7 Score 4 5 4 4   Anxiety Difficulty Not difficult at all Not difficult at all Not difficult at all Not difficult at all       Depression and anxiety Depression and anxiety managed with Bupropion  and Buspirone . Bupropion  aids smoking cessation. Prescription issue with Bupropion  addressed. - Continue Bupropion  150 mg oral twice daily. - Resend prescription for Bupropion . - Continue Buspirone  5 mg oral three times daily.

## 2024-02-11 NOTE — Assessment & Plan Note (Signed)
    02/11/2024    1:48 PM 11/10/2023    2:32 PM 09/29/2023   10:33 AM 09/05/2023    9:21 AM 08/25/2023   11:56 AM  Depression screen PHQ 2/9  Decreased Interest 0 0 0 0 0  Down, Depressed, Hopeless 0 2 0 0 1  PHQ - 2 Score 0 2 0 0 1  Altered sleeping 0 3 3 3 3   Tired, decreased energy 0 3 2 3 3   Change in appetite 0 0 0 2 2  Feeling bad or failure about yourself  0 0 0 0 0  Trouble concentrating 0 0 0 1 1  Moving slowly or fidgety/restless 0 0 0 0 0  Suicidal thoughts 0 0 0 0 0  PHQ-9 Score 0 8 5 9 10   Difficult doing work/chores Not difficult at all Not difficult at all Not difficult at all Not difficult at all Somewhat difficult

## 2024-02-11 NOTE — Assessment & Plan Note (Signed)
  Obstructive sleep apnea with potential improvement due to weight loss. Awaiting CPAP machine delivery. - Follow up on CPAP machine delivery. - Encourage weight loss to reduce CPAP dependence.

## 2024-02-11 NOTE — Assessment & Plan Note (Signed)
  Routine wellness visit with well-controlled blood pressure. No new medication allergies. Declined flu vaccine despite recommendation. - Provide referral to gynecologist for Pap smear. - Encourage regular exercise: 30 minutes, 5 days a week. - Advise balanced diet: 70-80% vegetables and protein, less carbohydrates. - Encourage hydration: 64 ounces of water daily. - Advise seatbelt use in car. Up-to-date with mammogram

## 2024-02-11 NOTE — Assessment & Plan Note (Signed)
 Tobacco use, in remission Tobacco use in remission. Quit smoking with Bupropion  aid.

## 2024-02-11 NOTE — Patient Instructions (Addendum)
 [facility] [address] [place] [address] [phone]        1. Morbid obesity (HCC) (Primary)  - tirzepatide (ZEPBOUND) 5 MG/0.5ML Pen; Inject 5 mg into the skin once a week.  Dispense: 2 mL; Refill: 0  2. Anxiety and depression  - buPROPion  (WELLBUTRIN  SR) 150 MG 12 hr tablet; Take 1 tablet (150 mg total) by mouth 2 (two) times daily. Take  150 mg once daily for 3 days; increase to 150 mg twice daily  Dispense: 120 tablet; Refill: 1  3. Tobacco use  - buPROPion  (WELLBUTRIN  SR) 150 MG 12 hr tablet; Take 1 tablet (150 mg total) by mouth 2 (two) times daily. Take  150 mg once daily for 3 days; increase to 150 mg twice daily  Dispense: 120 tablet; Refill: 1  4. OSA (obstructive sleep apnea)   5. Primary hypertension  - CBC - CMP14+EGFR  6. Encounter for screening for lipid disorder  - Lipid panel    It is important that you exercise regularly at least 30 minutes 5 times a week as tolerated  Think about what you will eat, plan ahead. Choose  clean, green, fresh or frozen over canned, processed or packaged foods which are more sugary, salty and fatty. 70 to 75% of food eaten should be vegetables and fruit. Three meals at set times with snacks allowed between meals, but they must be fruit or vegetables. Aim to eat over a 12 hour period , example 7 am to 7 pm, and STOP after  your last meal of the day. Drink water,generally about 64 ounces per day, no other drink is as healthy. Fruit juice is best enjoyed in a healthy way, by EATING the fruit.  Thanks for choosing [facility] we consider it a privelige to serve you.

## 2024-02-12 ENCOUNTER — Other Ambulatory Visit: Payer: Self-pay

## 2024-02-12 DIAGNOSIS — G4733 Obstructive sleep apnea (adult) (pediatric): Secondary | ICD-10-CM

## 2024-02-12 LAB — LIPID PANEL
Chol/HDL Ratio: 3.4 ratio (ref 0.0–4.4)
Cholesterol, Total: 175 mg/dL (ref 100–199)
HDL: 52 mg/dL (ref >39)
LDL Chol Calc (NIH): 108 mg/dL — ABNORMAL HIGH (ref 0–99)
Triglycerides: 79 mg/dL (ref 0–149)
VLDL Cholesterol Cal: 15 mg/dL (ref 5–40)

## 2024-02-12 LAB — CBC
Hematocrit: 35.4 % (ref 34.0–46.6)
Hemoglobin: 11.1 g/dL (ref 11.1–15.9)
MCH: 28.1 pg (ref 26.6–33.0)
MCHC: 31.4 g/dL — ABNORMAL LOW (ref 31.5–35.7)
MCV: 90 fL (ref 79–97)
Platelets: 467 x10E3/uL — ABNORMAL HIGH (ref 150–450)
RBC: 3.95 x10E6/uL (ref 3.77–5.28)
RDW: 14.2 % (ref 11.7–15.4)
WBC: 7 x10E3/uL (ref 3.4–10.8)

## 2024-02-12 LAB — CMP14+EGFR
ALT: 19 IU/L (ref 0–32)
AST: 14 IU/L (ref 0–40)
Albumin: 3.8 g/dL — ABNORMAL LOW (ref 3.9–4.9)
Alkaline Phosphatase: 85 IU/L (ref 41–116)
BUN/Creatinine Ratio: 13 (ref 9–23)
BUN: 11 mg/dL (ref 6–24)
Bilirubin Total: 0.3 mg/dL (ref 0.0–1.2)
CO2: 24 mmol/L (ref 20–29)
Calcium: 9.5 mg/dL (ref 8.7–10.2)
Chloride: 102 mmol/L (ref 96–106)
Creatinine, Ser: 0.88 mg/dL (ref 0.57–1.00)
Globulin, Total: 3.3 g/dL (ref 1.5–4.5)
Glucose: 83 mg/dL (ref 70–99)
Potassium: 4.1 mmol/L (ref 3.5–5.2)
Sodium: 141 mmol/L (ref 134–144)
Total Protein: 7.1 g/dL (ref 6.0–8.5)
eGFR: 85 mL/min/1.73 (ref >59)

## 2024-02-13 ENCOUNTER — Ambulatory Visit: Payer: Self-pay | Admitting: Nurse Practitioner

## 2024-02-19 ENCOUNTER — Other Ambulatory Visit: Payer: Self-pay

## 2024-02-24 ENCOUNTER — Encounter: Payer: Self-pay | Admitting: Internal Medicine

## 2024-02-24 ENCOUNTER — Ambulatory Visit (INDEPENDENT_AMBULATORY_CARE_PROVIDER_SITE_OTHER): Admitting: Internal Medicine

## 2024-02-24 VITALS — BP 121/85 | HR 86 | Temp 98.1°F | Ht 66.0 in | Wt 309.0 lb

## 2024-02-24 DIAGNOSIS — J4540 Moderate persistent asthma, uncomplicated: Secondary | ICD-10-CM

## 2024-02-24 DIAGNOSIS — J069 Acute upper respiratory infection, unspecified: Secondary | ICD-10-CM

## 2024-02-24 DIAGNOSIS — G4733 Obstructive sleep apnea (adult) (pediatric): Secondary | ICD-10-CM

## 2024-02-24 MED ORDER — AZELASTINE HCL 0.1 % NA SOLN: 1.0000 | Freq: Two times a day (BID) | NASAL | 12 refills | Status: AC

## 2024-02-24 MED ORDER — IPRATROPIUM-ALBUTEROL 0.5-2.5 (3) MG/3ML IN SOLN
3.0000 mL | Freq: Once | RESPIRATORY_TRACT | Status: AC
  Administered 2024-02-24: 3 mL via RESPIRATORY_TRACT

## 2024-02-24 MED ORDER — HYDROCOD POLI-CHLORPHE POLI ER 10-8 MG/5ML PO SUER: 5.0000 mL | Freq: Every evening | ORAL | 0 refills | Status: AC | PRN

## 2024-02-24 NOTE — Progress Notes (Signed)
 Rose Gutierrez    [id number]    30-Jun-1980  Primary Care Physician:Paseda, Folashade R, FNP Date of Appointment: 02/24/2024 Established Patient Visit  Chief complaint:   Chief Complaint  Patient presents with   Shortness of Breath    Patient states she's getting sick and having chest tightness.     HPI: Rose Gutierrez is a 43 y.o. woman with moderate persistent asthma and severe sleep apnea. Had a solitary pulmonary nodule which was needing repeat imaging. Also tobacco use disorder with recent cessation as well as childhood asthma. Normal spirometry. Sleep study - severe osa AH >102/hr  Interval Updates: Here for follow up. Having chest tightness x 4 days, sneezing, chest congestion.   Not taking dulera everyday like she should be, but it was helping when she did.  Hasn't been using albuterol .   She was contacted about a bipap machine but never received it.   Current Regimen: dulera 100 2 puffs twice a day, albuterol  prn Asthma Triggers: exertion, URI Exacerbations in the last year: History of hospitalization or intubation: never Allergy Testing/rhinitis: yes on singulair , xyzal . Doesn't feel like flonase  works.  GERD: ACT:      No data to display         FeNO: Serum Eos/IgE:    Continues to feel daily fatigue and dyspena.   Uses albuterol  inhaler about twice a week.  Daily symptoms of dyspnea on exertion with Chest tightness, wheezing.   Frequent sneezing and sinus congestion. Feels allergies poorly controlled on claritin  alone.   I have reviewed the patient's family social and past medical history and updated as appropriate.   Past Medical History:  Diagnosis Date   Angio-edema    Anxiety    Asthma    Chlamydia    Depression    Eczema    Hypertension    Recurrent upper respiratory infection (URI)    Sleep apnea    Suicidal ideation    Trichomonas infection     Past Surgical History:  Procedure Laterality Date   CESAREAN SECTION      cesearean section      Family History  Problem Relation Age of Onset   Cancer Mother        breast   Asthma Neg Hx     Social History   Occupational History   Not on file  Tobacco Use   Smoking status: Former    Current packs/day: 0.00    Average packs/day: 1 pack/day for 25.4 years (25.4 ttl pk-yrs)    Types: Cigarettes    Start date: 2000    Quit date: 09/04/2023    Years since quitting: 0.4   Smokeless tobacco: Never  Substance and Sexual Activity   Alcohol use: Yes    Comment: occasionally    Drug use: No   Sexual activity: Yes    Birth control/protection: None     Physical Exam: Blood pressure 121/85, pulse 86, temperature 98.1 F (36.7 C), temperature source Oral, height 5' 6 (1.676 m), weight (!) 309 lb (140.2 kg), SpO2 100%.  Gen:    No distress, frequent sneezing ENT:  mmm Lungs:    diminished, clear, no wheeze CV:         RRR no mrg   Data Reviewed: Imaging: I have personally reviewed the ct Chest June 2025 shows resolved ground glass nodule  PFTs:     Latest Ref Rng & Units 11/10/2023    3:33 PM  PFT Results  FVC-Pre L 2.44   FVC-Predicted Pre % 61   Pre FEV1/FVC % % 85   FEV1-Pre L 2.07   FEV1-Predicted Pre % 64    I have personally reviewed the patient's PFTs and normal spirometry   Labs: Lab Results  Component Value Date   NA 141 02/11/2024   K 4.1 02/11/2024   CO2 24 02/11/2024   GLUCOSE 83 02/11/2024   BUN 11 02/11/2024   CREATININE 0.88 02/11/2024   CALCIUM 9.5 02/11/2024   EGFR 85 02/11/2024   GFRNONAA >60 09/05/2023   Lab Results  Component Value Date   WBC 7.0 02/11/2024   HGB 11.1 02/11/2024   HCT 35.4 02/11/2024   MCV 90 02/11/2024   PLT 467 (H) 02/11/2024    Immunization status: Immunization History  Administered Date(s) Administered   HPV 9-valent 12/03/2017, 02/06/2018, 06/04/2018   HPV Quadrivalent 06/04/2018   Tdap 12/03/2017    External Records Personally Reviewed: sleep, primary  care  Assessment:  Severe sleep apnea AHI >102 Moderate persistent asthma not well controlled Seasonal allergic rhinitis not well controlled Solitary pulmonary nodule - resolved History of smoking, quit 2025  Plan/Recommendations: Your sleep study shows severe sleep apnea. You need a BIPAP Machine. I have already ordered this and you need to call [employer] to have a set up done in the office.   Please take the dulera 2 puffs twice a day faithfully to help your breathing and chest congestion.  Use albuterol  inhaler 2 puffs up to 4 times a day.   For your allergies continue cetirizine . Continue singulair . I am adding astelin nasal spray twice a day.   Keep staying off the cigarettes, great work!  Return to Care: Return in about 3 months (around 05/26/2024) for Dr Theodoro, MD visit.   Verdon Gore, MD Pulmonary and Critical Care Medicine [facility] Office:[phone]

## 2024-02-24 NOTE — Patient Instructions (Addendum)
 It was a pleasure to see you today!  Please schedule follow up with Dr Theodoro in 3 months.  If my schedule is not open yet, we will contact you with a reminder closer to that time. Please call [phone] if you haven't heard from us  a month before, and always call us  sooner if issues or concerns arise. You can also send us  a message through MyChart, but but aware that this is not to be used for urgent issues and it may take up to 5-7 days to receive a reply. Please be aware that you will likely be able to view your results before I have a chance to respond to them. Please give us  5 business days to respond to any non-urgent results.    Your sleep study shows severe sleep apnea. You need a BIPAP Machine. I have already ordered this and you need to call [employer] to have a set up done in the office.   Please take the dulera 2 puffs twice a day faithfully to help your breathing and chest congestion.  Use albuterol  inhaler 2 puffs up to 4 times a day.   For your allergies continue cetirizine . Continue singulair . I am adding astelin nasal spray twice a day.   Keep staying off the cigarettes, great work!  I have sent cough syrup to your pharmacy to help with the short term only.   You will follow up with Dr. Pawar in 3 months. Make sure this appointment is 31-90 days after using your new bipap machine.

## 2024-03-24 ENCOUNTER — Other Ambulatory Visit: Payer: Self-pay | Admitting: Nurse Practitioner

## 2024-03-24 DIAGNOSIS — G4733 Obstructive sleep apnea (adult) (pediatric): Secondary | ICD-10-CM

## 2024-03-24 MED ORDER — ZEPBOUND 7.5 MG/0.5ML ~~LOC~~ SOAJ: 7.5000 mg | SUBCUTANEOUS | 0 refills | Status: DC

## 2024-03-24 NOTE — Progress Notes (Unsigned)
 Rose Gutierrez

## 2024-04-12 ENCOUNTER — Telehealth: Payer: Self-pay

## 2024-04-12 ENCOUNTER — Ambulatory Visit: Payer: Self-pay | Admitting: Nurse Practitioner

## 2024-04-12 ENCOUNTER — Other Ambulatory Visit (HOSPITAL_COMMUNITY)
Admission: RE | Admit: 2024-04-12 | Discharge: 2024-04-12 | Disposition: A | Discharge status: Home / Self Care | Source: Ambulatory Visit | Attending: Obstetrics and Gynecology | Admitting: Obstetrics and Gynecology

## 2024-04-12 ENCOUNTER — Encounter: Payer: Self-pay | Admitting: Obstetrics and Gynecology

## 2024-04-12 ENCOUNTER — Encounter: Payer: Self-pay | Admitting: Nurse Practitioner

## 2024-04-12 ENCOUNTER — Other Ambulatory Visit: Payer: Self-pay

## 2024-04-12 ENCOUNTER — Ambulatory Visit: Admitting: Obstetrics and Gynecology

## 2024-04-12 ENCOUNTER — Telehealth: Payer: Self-pay | Admitting: Nurse Practitioner

## 2024-04-12 VITALS — BP 157/106 | HR 90 | Wt 305.0 lb

## 2024-04-12 DIAGNOSIS — Z124 Encounter for screening for malignant neoplasm of cervix: Secondary | ICD-10-CM | POA: Insufficient documentation

## 2024-04-12 DIAGNOSIS — Z202 Contact with and (suspected) exposure to infections with a predominantly sexual mode of transmission: Secondary | ICD-10-CM | POA: Diagnosis not present

## 2024-04-12 DIAGNOSIS — Z113 Encounter for screening for infections with a predominantly sexual mode of transmission: Secondary | ICD-10-CM | POA: Diagnosis not present

## 2024-04-12 NOTE — Progress Notes (Signed)
 Obstetrics and Gynecology New Patient Evaluation  Appointment Date: 04/12/2024  OBGYN Clinic: [facility]   Primary Care Provider: Paseda, Folashade R  Referring Provider: Paseda, Folashade R, FNP  Chief Complaint: pap smear  History of Present Illness: Rose Gutierrez is a 43 y.o.  [id number] (Patient's last menstrual period was 03/29/2024.), seen for the above chief complaint.   Pt desires STD screening today  Review of Systems: Pertinent items are noted in HPI.   Patient Active Problem List   Diagnosis Date Noted   Annual physical exam 02/11/2024   Depression 02/11/2024   Bilateral lower extremity edema 09/29/2023   Chest pain 09/05/2023   Acute midline low back pain 08/25/2023   Anxiety 07/14/2023   OSA (obstructive sleep apnea) 07/14/2023   Shortness of breath 07/14/2023   Allergic rhinitis 07/14/2023   Prediabetes 07/14/2023   Frequent noctunal urination 07/14/2023   Lung nodule 06/06/2023   History of tobacco use disorder 06/06/2023   Asthma exacerbation 06/06/2023   Sepsis due to pneumonia (HCC) 06/05/2023   Schizoaffective disorder, bipolar type (HCC) 08/27/2014   Suicidal ideation    Morbid obesity (HCC) 04/29/2014   HTN (hypertension) 04/29/2014   Labial cyst 04/29/2014   Past Medical History:  Past Medical History:  Diagnosis Date   Angio-edema    Anxiety    Asthma    Chlamydia    Depression    Eczema    Hypertension    Recurrent upper respiratory infection (URI)    Sleep apnea    Suicidal ideation    Trichomonas infection    Past Surgical History:  Past Surgical History:  Procedure Laterality Date   CESAREAN SECTION     Past Obstetrical History:  OB History  Gravida Para Term Preterm AB Living  4 3 3  0 1 3  SAB IAB Ectopic Multiple Live Births  1 0 0 0     # Outcome Date GA Lbr Len/2nd Weight Sex Type Anes PTL Lv  4 SAB           3 Term           2 Term           1 Term             Obstetric  Comments  SVD x 2. C-section x 1.    Past Gynecological History: As per HPI. Periods: qmonth, regular 5 days History of Pap Smear(s): Yes.   Last pap 2019, which was hpv and pap negative  Social History:  Social History   Socioeconomic History   Marital status: Single    Spouse name: Not on file   Number of children: 2   Years of education: Not on file   Highest education level: Not on file  Occupational History   Not on file  Tobacco Use   Smoking status: Former    Current packs/day: 0.00    Average packs/day: 1 pack/day for 25.4 years (25.4 ttl pk-yrs)    Types: Cigarettes    Start date: 2000    Quit date: 09/04/2023    Years since quitting: 0.6   Smokeless tobacco: Never  Substance and Sexual Activity   Alcohol use: Yes    Comment: occasionally    Drug use: No   Sexual activity: Yes    Birth control/protection: None  Other Topics Concern   Not on file  Social History Narrative   [place]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    Social Drivers  of Health   Tobacco Use: Medium Risk (04/12/2024)   Patient History    Smoking Tobacco Use: Former    Smokeless Tobacco Use: Never    Passive Exposure: Not on file  Financial Resource Strain: Not on file  Food Insecurity: Food Insecurity Present (06/05/2023)   Hunger Vital Sign    Worried About Running Out of Food in the Last Year: Sometimes true    Ran Out of Food in the Last Year: Sometimes true  Transportation Needs: No Transportation Needs (06/05/2023)   PRAPARE - Administrator, Civil Service (Medical): No    Lack of Transportation (Non-Medical): No  Physical Activity: Not on file  Stress: Not on file  Social Connections: Moderately Isolated (06/05/2023)   Social Connection and Isolation Panel    Frequency of Communication with Friends and Family: More than three times a week    Frequency of Social Gatherings with Friends and Family: More than three times a week    Attends Religious Services: More than 4 times per  year    Active Member of [employer] or Organizations: No    Attends Banker Meetings: Never    Marital Status: Never married  Intimate Partner Violence: Not At Risk (06/05/2023)   Humiliation, Afraid, Rape, and Kick questionnaire    Fear of Current or Ex-Partner: No    Emotionally Abused: No    Physically Abused: No    Sexually Abused: No  Depression (PHQ2-9): Low Risk (02/11/2024)   Depression (PHQ2-9)    PHQ-2 Score: 0  Alcohol Screen: Not on file  Housing: High Risk (06/05/2023)   Housing Stability Vital Sign    Unable to Pay for Housing in the Last Year: Yes    Number of Times Moved in the Last Year: 10    Homeless in the Last Year: Yes  Utilities: Not At Risk (06/05/2023)   AHC Utilities    Threatened with loss of utilities: No  Health Literacy: Not on file   Family History:  Family History  Problem Relation Age of Onset   Cancer Mother        breast   Asthma Neg Hx    Health Maintenance:  Mammogram(s): Yes.   Date: 2025  Medications Ayauna Mcnay had no medications administered during this visit. Current Outpatient Medications  Medication Sig Dispense Refill   albuterol  (VENTOLIN  HFA) 108 (90 Base) MCG/ACT inhaler Inhale 2 puffs into the lungs every 6 (six) hours as needed for wheezing or shortness of breath. 8 g 2   amLODipine  (NORVASC ) 5 MG tablet Take 1 tablet (5 mg total) by mouth daily. 90 tablet 1   azelastine  (ASTELIN ) 0.1 % nasal spray Place 1 spray into both nostrils 2 (two) times daily. Use in each nostril as directed 30 mL 12   Blood Pressure Monitoring (BLOOD PRESSURE KIT) DEVI 1 each by Does not apply route as directed. 1 each 0   buPROPion  (WELLBUTRIN  SR) 150 MG 12 hr tablet Take 1 tablet (150 mg total) by mouth 2 (two) times daily. Take  150 mg once daily for 3 days; increase to 150 mg twice daily 120 tablet 1   busPIRone  (BUSPAR ) 5 MG tablet TAKE 1 TABLET(5 MG) BY MOUTH THREE TIMES DAILY 90 tablet 3   chlorpheniramine-HYDROcodone  (TUSSIONEX)  10-8 MG/5ML Take 5 mLs by mouth at bedtime as needed for cough. 115 mL 0   cromolyn (OPTICROM) 4 % ophthalmic solution Place 1 drop into both eyes in the morning and at bedtime.  hydrochlorothiazide  (HYDRODIURIL ) 12.5 MG tablet Take 1 tablet (12.5 mg total) by mouth daily. 90 tablet 1   levocetirizine (XYZAL ) 5 MG tablet Take 1 tablet (5 mg total) by mouth every evening. 30 tablet 5   mometasone  (NASONEX ) 50 MCG/ACT nasal spray Place 1-2 sprays into the nose daily as needed (nasal congestion). 1 each 5   mometasone -formoterol  (DULERA) 100-5 MCG/ACT AERO Inhale 2 puffs into the lungs in the morning and at bedtime. 1 each 5   montelukast  (SINGULAIR ) 10 MG tablet Take 1 tablet (10 mg total) by mouth daily. 30 tablet 11   tirzepatide  (ZEPBOUND ) 7.5 MG/0.5ML Pen Inject 7.5 mg into the skin once a week. 2 mL 0   XIIDRA 5 % SOLN      No current facility-administered medications for this visit.   Allergies Patient has no known allergies.  Physical Exam:  BP (!) 157/106   Pulse 90   Wt (!) 305 lb (138.3 kg)   LMP 03/29/2024   BMI 49.23 kg/m  Body mass index is 49.23 kg/m. General appearance: Well nourished, well developed female in no acute distress.  Respiratory:  Normal respiratory effort Abdomen: obese, nttp, nd Neuro/Psych:  Normal mood and affect.  Skin:  Warm and dry.  Lymphatic:  No inguinal lymphadenopathy.   Cervical exam performed in the presence of a chaperone Pelvic exam: is limited by body habitus EGBUS: within normal limits Vagina: within normal limits and with no blood or discharge in the vault Cervix: very anterior and needed extra large Graves speculum. Nttp, normal appearing Uterus:  nonenlarged and non tender Adnexa:  normal adnexa and no mass, fullness, tenderness Rectovaginal: deferred  Laboratory: none  Radiology: none  Assessment: patient stable  Plan:  1. Encounter for Papanicolaou smear for cervical cancer screening (Primary) - Cytology - PAP( [facility])  2. Screening for STD (sexually transmitted disease) - RPR+HBsAg+HCVAb+...  3. STD exposure  Orders Placed This Encounter  Procedures   RPR+HBsAg+HCVAb+...    RTC PRN  No follow-ups on file.  Future Appointments  Date Time Provider Department Center  05/27/2024 11:00 AM Theodoro Lakes, MD [facility] [phone]    Bebe Izell Raddle MD Attending [facility])

## 2024-04-12 NOTE — Telephone Encounter (Signed)
 Copied from CRM #[id number]. Topic: Clinical - Prescription Issue >> Apr 12, 2024 11:39 AM Rose Gutierrez wrote: Pt is opn her last injection for tirzepatide  (ZEPBOUND ) 7.5 MG/0.5ML Pen, missed her appointment for today but scheduled another for Jan.

## 2024-04-13 ENCOUNTER — Other Ambulatory Visit: Payer: Self-pay

## 2024-04-13 DIAGNOSIS — G4733 Obstructive sleep apnea (adult) (pediatric): Secondary | ICD-10-CM

## 2024-04-13 LAB — CYTOLOGY - PAP
Adequacy: ABSENT
Chlamydia: NEGATIVE
Comment: NEGATIVE
Comment: NEGATIVE
Comment: NEGATIVE
Comment: NORMAL
Diagnosis: NEGATIVE
High risk HPV: NEGATIVE
Neisseria Gonorrhea: NEGATIVE
Trichomonas: POSITIVE — AB

## 2024-04-13 LAB — RPR+HBSAG+HCVAB+...
HIV Screen 4th Generation wRfx: NONREACTIVE
Hep C Virus Ab: NONREACTIVE
Hepatitis B Surface Ag: NEGATIVE
RPR Ser Ql: NONREACTIVE

## 2024-04-13 MED ORDER — ZEPBOUND 10 MG/0.5ML ~~LOC~~ SOAJ: 10.0000 mg | SUBCUTANEOUS | 0 refills | Status: AC

## 2024-04-13 NOTE — Telephone Encounter (Signed)
 error

## 2024-04-13 NOTE — Telephone Encounter (Signed)
 Sent to pcp [facility]

## 2024-04-13 NOTE — Telephone Encounter (Signed)
 SABRA

## 2024-04-13 NOTE — Telephone Encounter (Signed)
 Please advise [facility]

## 2024-04-16 ENCOUNTER — Ambulatory Visit: Payer: Self-pay | Admitting: Obstetrics and Gynecology

## 2024-04-16 DIAGNOSIS — A599 Trichomoniasis, unspecified: Secondary | ICD-10-CM | POA: Insufficient documentation

## 2024-04-16 MED ORDER — METRONIDAZOLE 500 MG PO TABS: 500.0000 mg | ORAL_TABLET | Freq: Two times a day (BID) | ORAL | 0 refills | Status: AC

## 2024-05-04 ENCOUNTER — Encounter: Payer: Self-pay | Admitting: Nurse Practitioner

## 2024-05-04 ENCOUNTER — Ambulatory Visit (INDEPENDENT_AMBULATORY_CARE_PROVIDER_SITE_OTHER): Payer: Self-pay | Admitting: Nurse Practitioner

## 2024-05-04 DIAGNOSIS — I10 Essential (primary) hypertension: Secondary | ICD-10-CM | POA: Diagnosis not present

## 2024-05-04 DIAGNOSIS — Z59819 Housing instability, housed unspecified: Secondary | ICD-10-CM | POA: Diagnosis not present

## 2024-05-04 DIAGNOSIS — Z720 Tobacco use: Secondary | ICD-10-CM

## 2024-05-04 DIAGNOSIS — G4733 Obstructive sleep apnea (adult) (pediatric): Secondary | ICD-10-CM | POA: Diagnosis not present

## 2024-05-04 DIAGNOSIS — F419 Anxiety disorder, unspecified: Secondary | ICD-10-CM | POA: Diagnosis not present

## 2024-05-04 DIAGNOSIS — Z87891 Personal history of nicotine dependence: Secondary | ICD-10-CM | POA: Insufficient documentation

## 2024-05-04 DIAGNOSIS — F322 Major depressive disorder, single episode, severe without psychotic features: Secondary | ICD-10-CM | POA: Diagnosis not present

## 2024-05-04 MED ORDER — AMLODIPINE BESYLATE 5 MG PO TABS: 5.0000 mg | ORAL_TABLET | Freq: Every day | ORAL | 1 refills | Status: AC

## 2024-05-04 MED ORDER — HYDROCHLOROTHIAZIDE 12.5 MG PO TABS: 12.5000 mg | ORAL_TABLET | Freq: Every day | ORAL | 1 refills | Status: AC

## 2024-05-04 MED ORDER — ZEPBOUND 12.5 MG/0.5ML ~~LOC~~ SOAJ: 12.5000 mg | SUBCUTANEOUS | 0 refills | Status: AC

## 2024-05-04 MED ORDER — BUSPIRONE HCL 5 MG PO TABS: 5.0000 mg | ORAL_TABLET | Freq: Three times a day (TID) | ORAL | 1 refills | Status: AC

## 2024-05-04 MED ORDER — BUPROPION HCL ER (SR) 150 MG PO TB12: 150.0000 mg | ORAL_TABLET | Freq: Two times a day (BID) | ORAL | 2 refills | Status: AC

## 2024-05-04 NOTE — Patient Instructions (Signed)
 1. Housing instability (Primary)  2. OSA (obstructive sleep apnea) - tirzepatide  (ZEPBOUND ) 12.5 MG/0.5ML Pen; Inject 12.5 mg into the skin once a week.  Dispense: 2 mL; Refill: 0  3. Morbid obesity (HCC) - tirzepatide  (ZEPBOUND ) 12.5 MG/0.5ML Pen; Inject 12.5 mg into the skin once a week.  Dispense: 2 mL; Refill: 0     It is important that you exercise regularly at least 30 minutes 5 times a week as tolerated  Think about what you will eat, plan ahead. Choose  clean, green, fresh or frozen over canned, processed or packaged foods which are more sugary, salty and fatty. 70 to 75% of food eaten should be vegetables and fruit. Three meals at set times with snacks allowed between meals, but they must be fruit or vegetables. Aim to eat over a 12 hour period , example 7 am to 7 pm, and STOP after  your last meal of the day. Drink water,generally about 64 ounces per day, no other drink is as healthy. Fruit juice is best enjoyed in a healthy way, by EATING the fruit.  Thanks for choosing [facility] we consider it a privelige to serve you.

## 2024-05-04 NOTE — Assessment & Plan Note (Signed)
 Obstructive sleep apnea Continues CPAP use but reports airflow detection issues. Pulmonology follow-up needed. - Advised contacting pulmonology office for CPAP machine adjustment. Continue Zyban 

## 2024-05-04 NOTE — Assessment & Plan Note (Signed)
" °    05/04/2024    3:32 PM 02/11/2024    1:49 PM 11/10/2023    2:34 PM 09/29/2023   10:36 AM  GAD 7 : Generalized Anxiety Score  Nervous, Anxious, on Edge 3 1 1 1   Control/stop worrying 2 0 0 0  Worry too much - different things 3 0 0 1  Trouble relaxing 3 3 3 2   Restless 2 0 0 0  Easily annoyed or irritable 3 0 1 0  Afraid - awful might happen 3 0 0 0  Total GAD 7 Score 19 4 5 4   Anxiety Difficulty Extremely difficult Not difficult at all Not difficult at all Not difficult at all  Depression and anxiety Depression screen high, exacerbated by stressors. Suicidal ideation previously discussed, but patient denies current suicidal ideation. On Wellbutrin  and buspirone . - Continue Wellbutrin  150 mg twice daily. - Continue buspirone  5 mg three times daily. - Offered referral to counseling or therapy, declined.         "

## 2024-05-04 NOTE — Progress Notes (Signed)
 "  Established Patient Office Visit  Subjective:  Patient ID: Rose Gutierrez, female    DOB: 1981/02/16  Age: 44 y.o. MRN: [id number]  CC:  Chief Complaint  Patient presents with   Medical Management of Chronic Issues    HPI  Discussed the use of AI scribe software for clinical note transcription with the patient, who gave verbal consent to proceed.  History of Present Illness Rose Gutierrez is a 44 year old female  has a past medical history of Angio-edema, Anxiety, Asthma, Chlamydia, Depression, Eczema, Hypertension, Recurrent upper respiratory infection (URI), Sleep apnea, Suicidal ideation, and Trichomonas infection.  who presents for follow-up for her chronic medical conditions  She has not been taking her blood pressure medications regularly due to stress, resulting in elevated readings of 141/99 mmHg and 136/94 mmHg. She is prescribed amlodipine  5 mg daily and hydrochlorothiazide  2.5 mg daily for hypertension.  She is experiencing stress related to her youngest daughter, impacting her ability to take medications consistently. She has decided to send her daughter to stay with her father temporarily to alleviate some stress.  She is on Mounjaro  10 mg weekly for weight management, with two doses remaining before a dosage increase. No nausea or vomiting is reported with this medication. Her diet is inconsistent, with some days of healthy eating and other days of not eating at all. Walking at work is her primary form of exercise.  She uses a CPAP machine but notes issues with airflow detection.  She is currently staying with a friend due to housing issues and is looking to secure her own place soon. She mentions financial challenges in finding housing that fits her income level.  She has a history of depression, with a recent high depression screening score. She is prescribed Wellbutrin  150 mg twice daily for depression and buspirone  5 mg three times daily for anxiety. No thoughts of  self-harm or harm to others, stating 'I don't want to hurt her, not me.'  She has quit smoking, although she admits to having 'hit it a couple of times.'  No chest pain, abdominal pain, nausea, or vomiting.    Assessment & Plan      Past Medical History:  Diagnosis Date   Angio-edema    Anxiety    Asthma    Chlamydia    Depression    Eczema    Hypertension    Recurrent upper respiratory infection (URI)    Sleep apnea    Suicidal ideation    Trichomonas infection     Past Surgical History:  Procedure Laterality Date   CESAREAN SECTION      Family History  Problem Relation Age of Onset   Cancer Mother        breast   Asthma Neg Hx     Social History   Socioeconomic History   Marital status: Single    Spouse name: Not on file   Number of children: 2   Years of education: Not on file   Highest education level: Not on file  Occupational History   Not on file  Tobacco Use   Smoking status: Former    Current packs/day: 0.00    Average packs/day: 1 pack/day for 25.4 years (25.4 ttl pk-yrs)    Types: Cigarettes    Start date: 2000    Quit date: 09/04/2023    Years since quitting: 0.6   Smokeless tobacco: Never  Substance and Sexual Activity   Alcohol use: Yes    Comment: occasionally  Drug use: No   Sexual activity: Yes    Birth control/protection: None  Other Topics Concern   Not on file  Social History Narrative   Lives in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    Social Drivers of Health   Tobacco Use: Medium Risk (05/04/2024)   Patient History    Smoking Tobacco Use: Former    Smokeless Tobacco Use: Never    Passive Exposure: Not on Actuary Strain: Not on file  Food Insecurity: No Food Insecurity (05/04/2024)   Epic    Worried About Programme Researcher, Broadcasting/film/video in the Last Year: Never true    Ran Out of Food in the Last Year: Never true  Transportation Needs: No Transportation Needs (05/04/2024)   Epic    Lack of Transportation (Medical): No     Lack of Transportation (Non-Medical): No  Physical Activity: Not on file  Stress: Not on file  Social Connections: Moderately Isolated (06/05/2023)   Social Connection and Isolation Panel    Frequency of Communication with Friends and Family: More than three times a week    Frequency of Social Gatherings with Friends and Family: More than three times a week    Attends Religious Services: More than 4 times per year    Active Member of [employer] or Organizations: No    Attends Banker Meetings: Never    Marital Status: Never married  Intimate Partner Violence: Not At Risk (05/04/2024)   Epic    Fear of Current or Ex-Partner: No    Emotionally Abused: No    Physically Abused: No    Sexually Abused: No  Depression (PHQ2-9): High Risk (05/04/2024)   Depression (PHQ2-9)    PHQ-2 Score: 18  Alcohol Screen: Not on file  Housing: High Risk (05/04/2024)   Epic    Unable to Pay for Housing in the Last Year: Yes    Number of Times Moved in the Last Year: 11    Homeless in the Last Year: Yes  Utilities: Not At Risk (05/04/2024)   Epic    Threatened with loss of utilities: No  Health Literacy: Not on file    Outpatient Medications Prior to Visit  Medication Sig Dispense Refill   mometasone -formoterol  (DULERA) 100-5 MCG/ACT AERO Inhale 2 puffs into the lungs in the morning and at bedtime. 1 each 5   tirzepatide  (ZEPBOUND ) 10 MG/0.5ML Pen Inject 10 mg into the skin once a week. 2 mL 0   busPIRone  (BUSPAR ) 5 MG tablet TAKE 1 TABLET(5 MG) BY MOUTH THREE TIMES DAILY 90 tablet 3   albuterol  (VENTOLIN  HFA) 108 (90 Base) MCG/ACT inhaler Inhale 2 puffs into the lungs every 6 (six) hours as needed for wheezing or shortness of breath. (Patient not taking: Reported on 05/04/2024) 8 g 2   azelastine  (ASTELIN ) 0.1 % nasal spray Place 1 spray into both nostrils 2 (two) times daily. Use in each nostril as directed (Patient not taking: Reported on 05/04/2024) 30 mL 12   Blood Pressure Monitoring (BLOOD  PRESSURE KIT) DEVI 1 each by Does not apply route as directed. (Patient not taking: Reported on 05/04/2024) 1 each 0   chlorpheniramine-HYDROcodone  (TUSSIONEX) 10-8 MG/5ML Take 5 mLs by mouth at bedtime as needed for cough. (Patient not taking: Reported on 05/04/2024) 115 mL 0   cromolyn (OPTICROM) 4 % ophthalmic solution Place 1 drop into both eyes in the morning and at bedtime. (Patient not taking: Reported on 05/04/2024)     levocetirizine (XYZAL ) 5 MG  tablet Take 1 tablet (5 mg total) by mouth every evening. (Patient not taking: Reported on 05/04/2024) 30 tablet 5   metroNIDAZOLE  (FLAGYL ) 500 MG tablet Take 1 tablet (500 mg total) by mouth 2 (two) times daily. (Patient not taking: Reported on 05/04/2024) 14 tablet 0   mometasone  (NASONEX ) 50 MCG/ACT nasal spray Place 1-2 sprays into the nose daily as needed (nasal congestion). (Patient not taking: Reported on 05/04/2024) 1 each 5   montelukast  (SINGULAIR ) 10 MG tablet Take 1 tablet (10 mg total) by mouth daily. (Patient not taking: Reported on 05/04/2024) 30 tablet 11   XIIDRA 5 % SOLN  (Patient not taking: Reported on 05/04/2024)     amLODipine  (NORVASC ) 5 MG tablet Take 1 tablet (5 mg total) by mouth daily. (Patient not taking: Reported on 05/04/2024) 90 tablet 1   buPROPion  (WELLBUTRIN  SR) 150 MG 12 hr tablet Take 1 tablet (150 mg total) by mouth 2 (two) times daily. Take  150 mg once daily for 3 days; increase to 150 mg twice daily (Patient not taking: Reported on 05/04/2024) 120 tablet 1   hydrochlorothiazide  (HYDRODIURIL ) 12.5 MG tablet Take 1 tablet (12.5 mg total) by mouth daily. (Patient not taking: Reported on 05/04/2024) 90 tablet 1   No facility-administered medications prior to visit.    Allergies[1]  ROS Review of Systems  Constitutional:  Negative for appetite change, chills, fatigue and fever.  HENT:  Negative for congestion, postnasal drip, rhinorrhea and sneezing.   Respiratory:  Negative for cough, shortness of breath and wheezing.    Cardiovascular:  Negative for chest pain, palpitations and leg swelling.  Gastrointestinal:  Negative for abdominal pain, constipation, nausea and vomiting.  Genitourinary:  Negative for difficulty urinating, dysuria, flank pain and frequency.  Musculoskeletal:  Negative for arthralgias, back pain, joint swelling and myalgias.  Skin:  Negative for color change, pallor, rash and wound.  Neurological:  Negative for dizziness, facial asymmetry, weakness, numbness and headaches.  Psychiatric/Behavioral:  Negative for behavioral problems, confusion, self-injury and suicidal ideas.       Objective:    Physical Exam Vitals and nursing note reviewed.  Constitutional:      General: She is not in acute distress.    Appearance: Normal appearance. She is obese. She is not ill-appearing, toxic-appearing or diaphoretic.  HENT:     Mouth/Throat:     Mouth: Mucous membranes are moist.     Pharynx: Oropharynx is clear. No oropharyngeal exudate or posterior oropharyngeal erythema.  Eyes:     General: No scleral icterus.       Right eye: No discharge.        Left eye: No discharge.     Extraocular Movements: Extraocular movements intact.     Conjunctiva/sclera: Conjunctivae normal.  Cardiovascular:     Rate and Rhythm: Normal rate and regular rhythm.     Pulses: Normal pulses.     Heart sounds: Normal heart sounds. No murmur heard.    No friction rub. No gallop.  Pulmonary:     Effort: Pulmonary effort is normal. No respiratory distress.     Breath sounds: Normal breath sounds. No stridor. No wheezing, rhonchi or rales.  Chest:     Chest wall: No tenderness.  Abdominal:     General: There is no distension.     Palpations: Abdomen is soft.     Tenderness: There is no abdominal tenderness. There is no right CVA tenderness, left CVA tenderness or guarding.  Musculoskeletal:  General: No swelling, tenderness, deformity or signs of injury.     Right lower leg: No edema.     Left lower  leg: No edema.  Skin:    General: Skin is warm and dry.     Capillary Refill: Capillary refill takes less than 2 seconds.     Coloration: Skin is not jaundiced.     Findings: No bruising, erythema or lesion.  Neurological:     Mental Status: She is alert and oriented to person, place, and time.     Motor: No weakness.     Coordination: Coordination normal.     Gait: Gait normal.  Psychiatric:        Mood and Affect: Mood normal.        Behavior: Behavior normal.        Thought Content: Thought content normal.        Judgment: Judgment normal.     BP (!) 136/94   Pulse 77   Wt 297 lb (134.7 kg)   LMP 03/29/2024   SpO2 99%   BMI 47.94 kg/m  Wt Readings from Last 3 Encounters:  05/04/24 297 lb (134.7 kg)  04/12/24 (!) 305 lb (138.3 kg)  02/24/24 (!) 309 lb (140.2 kg)    Lab Results  Component Value Date   TSH 1.870 11/03/2013   Lab Results  Component Value Date   WBC 7.0 02/11/2024   HGB 11.1 02/11/2024   HCT 35.4 02/11/2024   MCV 90 02/11/2024   PLT 467 (H) 02/11/2024   Lab Results  Component Value Date   NA 141 02/11/2024   K 4.1 02/11/2024   CO2 24 02/11/2024   GLUCOSE 83 02/11/2024   BUN 11 02/11/2024   CREATININE 0.88 02/11/2024   BILITOT 0.3 02/11/2024   ALKPHOS 85 02/11/2024   AST 14 02/11/2024   ALT 19 02/11/2024   PROT 7.1 02/11/2024   ALBUMIN 3.8 (L) 02/11/2024   CALCIUM 9.5 02/11/2024   ANIONGAP 9 09/05/2023   EGFR 85 02/11/2024   Lab Results  Component Value Date   CHOL 175 02/11/2024   Lab Results  Component Value Date   HDL 52 02/11/2024   Lab Results  Component Value Date   LDLCALC 108 (H) 02/11/2024   Lab Results  Component Value Date   TRIG 79 02/11/2024   Lab Results  Component Value Date   CHOLHDL 3.4 02/11/2024   Lab Results  Component Value Date   HGBA1C 6.1 (A) 07/14/2023      Assessment & Plan:   Problem List Items Addressed This Visit       Cardiovascular and Mediastinum   HTN (hypertension)         05/04/2024    3:40 PM 05/04/2024    3:26 PM 04/12/2024   10:39 AM 02/24/2024   11:19 AM 02/11/2024    1:41 PM 12/24/2023    1:30 PM 12/10/2023    9:08 PM  BP/Weight  Systolic BP 136 141 157 121 112 114   Diastolic BP 94 99 106 85 78 86   Wt. (Lbs)  297 305 309 309 318.6 319  BMI  47.94 kg/m2 49.23 kg/m2 49.87 kg/m2 50.25 kg/m2 51.82 kg/m2 51.49 kg/m2   Hypertension Blood pressure elevated likely due to non-adherence to medication, stress, and housing instability. - Encouraged adherence to amlodipine  5 mg daily and hydrochlorothiazide  2.5 mg daily. - Advised on a heart-healthy, low-salt, low-fat diet. - Recommended avoiding fatty and fried foods.  Relevant Medications   amLODipine  (NORVASC ) 5 MG tablet   hydrochlorothiazide  (HYDRODIURIL ) 12.5 MG tablet     Respiratory   OSA (obstructive sleep apnea)   Obstructive sleep apnea Continues CPAP use but reports airflow detection issues. Pulmonology follow-up needed. - Advised contacting pulmonology office for CPAP machine adjustment. Continue Zyban        Relevant Medications   tirzepatide  (ZEPBOUND ) 12.5 MG/0.5ML Pen     Other   Morbid obesity (HCC)   Wt Readings from Last 3 Encounters:  05/04/24 297 lb (134.7 kg)  04/12/24 (!) 305 lb (138.3 kg)  02/24/24 (!) 309 lb (140.2 kg)   Body mass index is 47.94 kg/m.   Weight management essential for health improvement. Lifestyle includes inconsistent diet and limited exercise. - Encouraged moderate to vigorous exercise for 30 minutes, five days a week as tolerated. - Advised on a diet rich in vegetables and protein, with reduced carbohydrate intake.       Relevant Medications   tirzepatide  (ZEPBOUND ) 12.5 MG/0.5ML Pen   Anxiety      05/04/2024    3:32 PM 02/11/2024    1:49 PM 11/10/2023    2:34 PM 09/29/2023   10:36 AM  GAD 7 : Generalized Anxiety Score  Nervous, Anxious, on Edge 3 1 1 1   Control/stop worrying 2 0 0 0  Worry too much - different things 3 0 0 1  Trouble  relaxing 3 3 3 2   Restless 2 0 0 0  Easily annoyed or irritable 3 0 1 0  Afraid - awful might happen 3 0 0 0  Total GAD 7 Score 19 4 5 4   Anxiety Difficulty Extremely difficult Not difficult at all Not difficult at all Not difficult at all  Depression and anxiety Depression screen high, exacerbated by stressors. Suicidal ideation previously discussed, but patient denies current suicidal ideation. On Wellbutrin  and buspirone . - Continue Wellbutrin  150 mg twice daily. - Continue buspirone  5 mg three times daily. - Offered referral to counseling or therapy, declined.              Relevant Medications   buPROPion  (WELLBUTRIN  SR) 150 MG 12 hr tablet   busPIRone  (BUSPAR ) 5 MG tablet   Depression      05/04/2024    3:30 PM 02/11/2024    1:48 PM 11/10/2023    2:32 PM  PHQ9 SCORE ONLY  PHQ-9 Total Score 18 0 8      Data saved with a previous flowsheet row definition   Depression and anxiety Depression screen high, exacerbated by stressors. Suicidal ideation previously discussed, but patient denies current suicidal ideation. On Wellbutrin  and buspirone . - Continue Wellbutrin  150 mg twice daily. - Continue buspirone  5 mg three times daily. - Offered referral to counseling or therapy, declined.  Housing instability Staying with a friend due to financial constraints. Plans to secure independent housing. - Encouraged efforts to secure independent housing.          Relevant Medications   buPROPion  (WELLBUTRIN  SR) 150 MG 12 hr tablet   busPIRone  (BUSPAR ) 5 MG tablet   Housing instability - Primary    Staying with a friend due to financial constraints. Plans to secure independent housing. - Encouraged efforts to secure independent housing.      Former smoker   On Wellbutrin  150 mg twice daily Encouraged to continue to abstain from smoking cigarettes      Relevant Medications   buPROPion  (WELLBUTRIN  SR) 150 MG 12 hr tablet    Meds ordered  this encounter  Medications    tirzepatide  (ZEPBOUND ) 12.5 MG/0.5ML Pen    Sig: Inject 12.5 mg into the skin once a week.    Dispense:  2 mL    Refill:  0   amLODipine  (NORVASC ) 5 MG tablet    Sig: Take 1 tablet (5 mg total) by mouth daily.    Dispense:  90 tablet    Refill:  1   hydrochlorothiazide  (HYDRODIURIL ) 12.5 MG tablet    Sig: Take 1 tablet (12.5 mg total) by mouth daily.    Dispense:  90 tablet    Refill:  1   buPROPion  (WELLBUTRIN  SR) 150 MG 12 hr tablet    Sig: Take 1 tablet (150 mg total) by mouth 2 (two) times daily.    Dispense:  120 tablet    Refill:  2   busPIRone  (BUSPAR ) 5 MG tablet    Sig: Take 1 tablet (5 mg total) by mouth 3 (three) times daily.    Dispense:  180 tablet    Refill:  1    ZERO refills remain on this prescription. Your patient is requesting advance approval of refills for this medication to PREVENT ANY MISSED DOSES    Follow-up: Return in about 2 months (around 07/02/2024) for HTN, DEPRESSION, ANXIETY.    Rose Gutierrez R Ayianna Darnold, FNP    [1] No Known Allergies  "

## 2024-05-04 NOTE — Assessment & Plan Note (Signed)
" °  Staying with a friend due to financial constraints. Plans to secure independent housing. - Encouraged efforts to secure independent housing. "

## 2024-05-04 NOTE — Assessment & Plan Note (Signed)
" ° ° °    05/04/2024    3:40 PM 05/04/2024    3:26 PM 04/12/2024   10:39 AM 02/24/2024   11:19 AM 02/11/2024    1:41 PM 12/24/2023    1:30 PM 12/10/2023    9:08 PM  BP/Weight  Systolic BP 136 141 157 121 112 114   Diastolic BP 94 99 106 85 78 86   Wt. (Lbs)  297 305 309 309 318.6 319  BMI  47.94 kg/m2 49.23 kg/m2 49.87 kg/m2 50.25 kg/m2 51.82 kg/m2 51.49 kg/m2   Hypertension Blood pressure elevated likely due to non-adherence to medication, stress, and housing instability. - Encouraged adherence to amlodipine  5 mg daily and hydrochlorothiazide  2.5 mg daily. - Advised on a heart-healthy, low-salt, low-fat diet. - Recommended avoiding fatty and fried foods.  "

## 2024-05-04 NOTE — Assessment & Plan Note (Addendum)
 On Wellbutrin  150 mg twice daily Encouraged to continue to abstain from smoking cigarettes

## 2024-05-04 NOTE — Assessment & Plan Note (Addendum)
" °    05/04/2024    3:30 PM 02/11/2024    1:48 PM 11/10/2023    2:32 PM  PHQ9 SCORE ONLY  PHQ-9 Total Score 18 0 8      Data saved with a previous flowsheet row definition   Depression and anxiety Depression screen high, exacerbated by stressors. Suicidal ideation previously discussed, but patient denies current suicidal ideation. On Wellbutrin  and buspirone . - Continue Wellbutrin  150 mg twice daily. - Continue buspirone  5 mg three times daily. - Offered referral to counseling or therapy, declined.  Housing instability Staying with a friend due to financial constraints. Plans to secure independent housing. - Encouraged efforts to secure independent housing.     "

## 2024-05-04 NOTE — Assessment & Plan Note (Signed)
 Wt Readings from Last 3 Encounters:  05/04/24 297 lb (134.7 kg)  04/12/24 (!) 305 lb (138.3 kg)  02/24/24 (!) 309 lb (140.2 kg)   Body mass index is 47.94 kg/m.   Weight management essential for health improvement. Lifestyle includes inconsistent diet and limited exercise. - Encouraged moderate to vigorous exercise for 30 minutes, five days a week as tolerated. - Advised on a diet rich in vegetables and protein, with reduced carbohydrate intake.

## 2024-05-06 ENCOUNTER — Encounter: Payer: Self-pay | Admitting: Nurse Practitioner

## 2024-05-06 ENCOUNTER — Other Ambulatory Visit: Payer: Self-pay

## 2024-05-06 ENCOUNTER — Telehealth: Payer: Self-pay | Admitting: Nurse Practitioner

## 2024-05-06 NOTE — Telephone Encounter (Signed)
 tirzepatide  (ZEPBOUND ) 12.5 MG/0.5ML Pen [[id number]]

## 2024-05-06 NOTE — Telephone Encounter (Signed)
 Request for zepbound  was sent over however medication was sent in 05/04/24. KH

## 2024-05-06 NOTE — Telephone Encounter (Signed)
 Sent in 05/04/24. KH

## 2024-05-27 ENCOUNTER — Encounter

## 2024-05-27 NOTE — Assessment & Plan Note (Signed)
 Rose Gutierrez

## 2024-05-27 NOTE — Progress Notes (Unsigned)
 "  Pulmonology Office Visit   Subjective:  Patient ID: Rose Gutierrez, female    DOB: December 20, 1980  MRN: [id number]  Referred by: Paseda, Folashade R, FNP  CC: No chief complaint on file.   HPI Rose Gutierrez is a 44 y.o. female ex-smoker [quit 2025] with moderate persistent asthma and severe OSA, SPN-resolved follows for asthma, OSA.  Dr. Meade 02/24/24> continue Dulera and as needed albuterol .  Continue Singulair  and cetirizine .  BiPAP was ordered.  Respective notes from provider reviewed as appropriate to gather relevant information for patient care.   Discussed the use of AI scribe software for clinical note transcription with the patient, who gave verbal consent to proceed.  History of Present Illness          OSA history: ***  ASTHMA:  First diagnosed: *** FH: *** Triggers: Exertion, URI Intubated: None. Last steroid use: *** Times albuterol  used: *** Treatment: *** Others: pregnancy- ***, allergy symptoms-yes on Xyzal  and Singulair , throat choking/stridor- ***, GERD- ***, OSA- ***    Lung Health: Functional status: *** Covid vaccine: *** Influenza vaccine: *** Pneumonococcal vaccine: *** Smoking: *** Occupational exposure/pets: ***  Symptoms: ***  Mouth breather: ***  Preferred sleeping position: ***  Snoring- ***  Witnessed apnea- ***  Gasping/choking- ***  morning HA/dry mouth- ***   tired on awakening, excessive daytime sleepiness- *** Restless legs- ***  Hypnogogic hallucination- *** sleep paralysis- ***  sleep walking- ***  sleep talking- ***   night terrors/mares- ***   dream enactment- ***  Sleep routine:    -Bed: ***, onset ***  -Nocturnal awakenings: ***   -Wake: ***   -Napping:***   -perceived sleep time: ***   -sleep hygiene: ***  Social Hist/Habits:  -Caffeine: ***   -Alcohol: ***   -Nicotine :***   -Occupation: ***  {PULM QUESTIONNAIRES (Optional):33196}  PAP download compliance data: *** Encore/Airview Pressure: Hours of usage: Days  used >4hr: Leak: AHI:  PRIOR TESTS and IMAGING: HST May 2025 AHI 4% 103, O2 nadir 55%, desaturation 150-minute, weight 338 pound CPAP titration August 2025: BiPAP 25/22 recommended.  spirometer July 2025: No obstruction, reduced FEV1 and FVC suggesting restrictive lung disease.  CT chest May 2025: Reviewed and unremarkable.  No emphysematous changes.      No data to display          Allergies: Patient has no known allergies. Current Medications[1] Past Medical History:  Diagnosis Date   Angio-edema    Anxiety    Asthma    Chlamydia    Depression    Eczema    Hypertension    Recurrent upper respiratory infection (URI)    Sleep apnea    Suicidal ideation    Trichomonas infection    Past Surgical History:  Procedure Laterality Date   CESAREAN SECTION     Family History  Problem Relation Age of Onset   Cancer Mother        breast   Asthma Neg Hx    Social History   Socioeconomic History   Marital status: Single    Spouse name: Not on file   Number of children: 2   Years of education: Not on file   Highest education level: Not on file  Occupational History   Not on file  Tobacco Use   Smoking status: Former    Current packs/day: 0.00    Average packs/day: 1 pack/day for 25.4 years (25.4 ttl pk-yrs)    Types: Cigarettes    Start date: 2000    Quit  date: 09/04/2023    Years since quitting: 0.7   Smokeless tobacco: Never  Substance and Sexual Activity   Alcohol use: Yes    Comment: occasionally    Drug use: No   Sexual activity: Yes    Birth control/protection: None  Other Topics Concern   Not on file  Social History Narrative   Lives [place]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    Social Drivers of Health   Tobacco Use: Medium Risk (05/04/2024)   Patient History    Smoking Tobacco Use: Former    Smokeless Tobacco Use: Never    Passive Exposure: Not on Actuary Strain: Not on file  Food Insecurity: No Food Insecurity (05/04/2024)   Epic     Worried About Programme Researcher, Broadcasting/film/video in the Last Year: Never true    Ran Out of Food in the Last Year: Never true  Transportation Needs: No Transportation Needs (05/04/2024)   Epic    Lack of Transportation (Medical): No    Lack of Transportation (Non-Medical): No  Physical Activity: Not on file  Stress: Not on file  Social Connections: Moderately Isolated (06/05/2023)   Social Connection and Isolation Panel    Frequency of Communication with Friends and Family: More than three times a week    Frequency of Social Gatherings with Friends and Family: More than three times a week    Attends Religious Services: More than 4 times per year    Active Member of [employer] or Organizations: No    Attends Banker Meetings: Never    Marital Status: Never married  Intimate Partner Violence: Not At Risk (05/04/2024)   Epic    Fear of Current or Ex-Partner: No    Emotionally Abused: No    Physically Abused: No    Sexually Abused: No  Depression (PHQ2-9): High Risk (05/04/2024)   Depression (PHQ2-9)    PHQ-2 Score: 18  Alcohol Screen: Not on file  Housing: High Risk (05/04/2024)   Epic    Unable to Pay for Housing in the Last Year: Yes    Number of Times Moved in the Last Year: 11    Homeless in the Last Year: Yes  Utilities: Not At Risk (05/04/2024)   Epic    Threatened with loss of utilities: No  Health Literacy: Not on file       Objective:  There were no vitals taken for this visit. {Pulm Vitals (Optional):32837}  Physical Exam: Physical Exam           Diagnostic Review:  {Labs (Optional):32838}       Assessment & Plan:   Assessment & Plan OSA (obstructive sleep apnea)     Moderate persistent asthma without complication        Assessment and Plan              Notes from Dr. Meade 02/24/24 reviewed as to gather relevant information for patient care and formulating plan.  He/She was counselled about not driving while drowsy which is common side effect of  sleep related disorders.   No follow-ups on file.   I personally spent a total of *** minutes in the care of the patient today including preparing to see the patient, getting/reviewing separately obtained history, performing a medically appropriate exam/evaluation, counseling and educating, placing orders, documenting clinical information in the EHR, independently interpreting results, and communicating results.   Hulda Reddix, MD    [1]  Current Outpatient Medications:    albuterol  (VENTOLIN  HFA)  108 (90 Base) MCG/ACT inhaler, Inhale 2 puffs into the lungs every 6 (six) hours as needed for wheezing or shortness of breath. (Patient not taking: Reported on 05/04/2024), Disp: 8 g, Rfl: 2   amLODipine  (NORVASC ) 5 MG tablet, Take 1 tablet (5 mg total) by mouth daily., Disp: 90 tablet, Rfl: 1   azelastine  (ASTELIN ) 0.1 % nasal spray, Place 1 spray into both nostrils 2 (two) times daily. Use in each nostril as directed (Patient not taking: Reported on 05/04/2024), Disp: 30 mL, Rfl: 12   Blood Pressure Monitoring (BLOOD PRESSURE KIT) DEVI, 1 each by Does not apply route as directed. (Patient not taking: Reported on 05/04/2024), Disp: 1 each, Rfl: 0   buPROPion  (WELLBUTRIN  SR) 150 MG 12 hr tablet, Take 1 tablet (150 mg total) by mouth 2 (two) times daily., Disp: 120 tablet, Rfl: 2   busPIRone  (BUSPAR ) 5 MG tablet, Take 1 tablet (5 mg total) by mouth 3 (three) times daily., Disp: 180 tablet, Rfl: 1   chlorpheniramine-HYDROcodone  (TUSSIONEX) 10-8 MG/5ML, Take 5 mLs by mouth at bedtime as needed for cough. (Patient not taking: Reported on 05/04/2024), Disp: 115 mL, Rfl: 0   cromolyn (OPTICROM) 4 % ophthalmic solution, Place 1 drop into both eyes in the morning and at bedtime. (Patient not taking: Reported on 05/04/2024), Disp: , Rfl:    hydrochlorothiazide  (HYDRODIURIL ) 12.5 MG tablet, Take 1 tablet (12.5 mg total) by mouth daily., Disp: 90 tablet, Rfl: 1   levocetirizine (XYZAL ) 5 MG tablet, Take 1 tablet (5 mg  total) by mouth every evening. (Patient not taking: Reported on 05/04/2024), Disp: 30 tablet, Rfl: 5   metroNIDAZOLE  (FLAGYL ) 500 MG tablet, Take 1 tablet (500 mg total) by mouth 2 (two) times daily. (Patient not taking: Reported on 05/04/2024), Disp: 14 tablet, Rfl: 0   mometasone  (NASONEX ) 50 MCG/ACT nasal spray, Place 1-2 sprays into the nose daily as needed (nasal congestion). (Patient not taking: Reported on 05/04/2024), Disp: 1 each, Rfl: 5   mometasone -formoterol  (DULERA) 100-5 MCG/ACT AERO, Inhale 2 puffs into the lungs in the morning and at bedtime., Disp: 1 each, Rfl: 5   montelukast  (SINGULAIR ) 10 MG tablet, Take 1 tablet (10 mg total) by mouth daily. (Patient not taking: Reported on 05/04/2024), Disp: 30 tablet, Rfl: 11   tirzepatide  (ZEPBOUND ) 10 MG/0.5ML Pen, Inject 10 mg into the skin once a week., Disp: 2 mL, Rfl: 0   tirzepatide  (ZEPBOUND ) 12.5 MG/0.5ML Pen, Inject 12.5 mg into the skin once a week., Disp: 2 mL, Rfl: 0   XIIDRA 5 % SOLN, , Disp: , Rfl:   "

## 2024-07-02 ENCOUNTER — Ambulatory Visit: Payer: Self-pay | Admitting: Nurse Practitioner
